# Patient Record
Sex: Male | Born: 1945 | Race: White | Hispanic: No | Marital: Married | Smoking: Former smoker
Health system: Southern US, Community
[De-identification: ages and names within clinical notes are randomized; demographics above are authoritative.]

## PROBLEM LIST (undated history)

## (undated) DIAGNOSIS — I10 Essential (primary) hypertension: Secondary | ICD-10-CM

## (undated) DIAGNOSIS — K219 Gastro-esophageal reflux disease without esophagitis: Secondary | ICD-10-CM

## (undated) DIAGNOSIS — R7303 Prediabetes: Secondary | ICD-10-CM

## (undated) DIAGNOSIS — N400 Enlarged prostate without lower urinary tract symptoms: Secondary | ICD-10-CM

## (undated) DIAGNOSIS — E785 Hyperlipidemia, unspecified: Secondary | ICD-10-CM

## (undated) DIAGNOSIS — C801 Malignant (primary) neoplasm, unspecified: Secondary | ICD-10-CM

## (undated) DIAGNOSIS — E559 Vitamin D deficiency, unspecified: Secondary | ICD-10-CM

## (undated) HISTORY — DX: Malignant (primary) neoplasm, unspecified: C80.1

## (undated) HISTORY — DX: Hyperlipidemia, unspecified: E78.5

## (undated) HISTORY — DX: Gastro-esophageal reflux disease without esophagitis: K21.9

## (undated) HISTORY — DX: Prediabetes: R73.03

## (undated) HISTORY — DX: Vitamin D deficiency, unspecified: E55.9

## (undated) HISTORY — DX: Essential (primary) hypertension: I10

## (undated) HISTORY — DX: Benign prostatic hyperplasia without lower urinary tract symptoms: N40.0

---

## 1964-11-08 HISTORY — PX: CEREBRAL ANEURYSM REPAIR: SHX164

## 1994-11-08 HISTORY — PX: CEREBRAL ANEURYSM REPAIR: SHX164

## 2000-10-17 ENCOUNTER — Encounter: Payer: Self-pay | Admitting: Emergency Medicine

## 2000-10-17 ENCOUNTER — Emergency Department (HOSPITAL_COMMUNITY): Admission: EM | Admit: 2000-10-17 | Discharge: 2000-10-17 | Payer: Self-pay | Admitting: Emergency Medicine

## 2004-01-06 ENCOUNTER — Ambulatory Visit (HOSPITAL_COMMUNITY): Admission: RE | Admit: 2004-01-06 | Discharge: 2004-01-06 | Payer: Self-pay | Admitting: Internal Medicine

## 2008-08-22 ENCOUNTER — Encounter: Admission: RE | Admit: 2008-08-22 | Discharge: 2008-08-22 | Payer: Self-pay | Admitting: Sports Medicine

## 2008-08-26 ENCOUNTER — Encounter: Admission: RE | Admit: 2008-08-26 | Discharge: 2008-08-26 | Payer: Self-pay | Admitting: Sports Medicine

## 2008-09-11 ENCOUNTER — Encounter: Admission: RE | Admit: 2008-09-11 | Discharge: 2008-09-11 | Payer: Self-pay | Admitting: Sports Medicine

## 2008-09-27 ENCOUNTER — Encounter: Admission: RE | Admit: 2008-09-27 | Discharge: 2008-09-27 | Payer: Self-pay | Admitting: Sports Medicine

## 2008-12-18 ENCOUNTER — Ambulatory Visit (HOSPITAL_COMMUNITY): Admission: RE | Admit: 2008-12-18 | Discharge: 2008-12-18 | Payer: Self-pay | Admitting: Internal Medicine

## 2009-05-26 ENCOUNTER — Encounter: Admission: RE | Admit: 2009-05-26 | Discharge: 2009-05-26 | Payer: Self-pay | Admitting: Orthopaedic Surgery

## 2009-06-23 ENCOUNTER — Encounter: Admission: RE | Admit: 2009-06-23 | Discharge: 2009-06-23 | Payer: Self-pay | Admitting: Orthopaedic Surgery

## 2009-07-10 ENCOUNTER — Encounter: Admission: RE | Admit: 2009-07-10 | Discharge: 2009-07-10 | Payer: Self-pay | Admitting: Orthopaedic Surgery

## 2010-03-10 ENCOUNTER — Ambulatory Visit (HOSPITAL_COMMUNITY): Admission: RE | Admit: 2010-03-10 | Discharge: 2010-03-10 | Payer: Self-pay | Admitting: Internal Medicine

## 2011-03-10 ENCOUNTER — Other Ambulatory Visit (HOSPITAL_COMMUNITY): Payer: Self-pay | Admitting: Internal Medicine

## 2011-03-10 ENCOUNTER — Ambulatory Visit (HOSPITAL_COMMUNITY)
Admission: RE | Admit: 2011-03-10 | Discharge: 2011-03-10 | Disposition: A | Discharge status: Home / Self Care | Payer: Medicare PPO | Source: Ambulatory Visit | Attending: Internal Medicine | Admitting: Internal Medicine

## 2011-03-10 DIAGNOSIS — R059 Cough, unspecified: Secondary | ICD-10-CM

## 2011-03-10 DIAGNOSIS — I10 Essential (primary) hypertension: Secondary | ICD-10-CM | POA: Insufficient documentation

## 2011-03-10 DIAGNOSIS — R05 Cough: Secondary | ICD-10-CM

## 2013-07-23 LAB — HM COLONOSCOPY

## 2013-10-16 ENCOUNTER — Ambulatory Visit: Payer: Self-pay | Admitting: Physician Assistant

## 2013-11-12 ENCOUNTER — Encounter: Payer: Self-pay | Admitting: Internal Medicine

## 2013-11-12 ENCOUNTER — Other Ambulatory Visit: Payer: Self-pay | Admitting: Internal Medicine

## 2013-11-12 MED ORDER — SIMVASTATIN 40 MG PO TABS: 40.0000 mg | ORAL_TABLET | Freq: Every evening | ORAL | Status: DC

## 2013-11-12 MED ORDER — ENALAPRIL MALEATE 20 MG PO TABS: 20.0000 mg | ORAL_TABLET | Freq: Every day | ORAL | Status: DC

## 2013-11-12 MED ORDER — HYDROCHLOROTHIAZIDE 25 MG PO TABS: 25.0000 mg | ORAL_TABLET | Freq: Every day | ORAL | Status: DC

## 2013-11-12 MED ORDER — ATENOLOL 25 MG PO TABS: 25.0000 mg | ORAL_TABLET | Freq: Every day | ORAL | Status: DC

## 2013-11-12 NOTE — Telephone Encounter (Signed)
This encounter was created in error - please disregard.

## 2013-12-30 DIAGNOSIS — N400 Enlarged prostate without lower urinary tract symptoms: Secondary | ICD-10-CM | POA: Insufficient documentation

## 2013-12-30 DIAGNOSIS — K219 Gastro-esophageal reflux disease without esophagitis: Secondary | ICD-10-CM | POA: Insufficient documentation

## 2013-12-30 DIAGNOSIS — E785 Hyperlipidemia, unspecified: Secondary | ICD-10-CM | POA: Insufficient documentation

## 2013-12-30 DIAGNOSIS — R7309 Other abnormal glucose: Secondary | ICD-10-CM | POA: Insufficient documentation

## 2013-12-30 DIAGNOSIS — I10 Essential (primary) hypertension: Secondary | ICD-10-CM | POA: Insufficient documentation

## 2013-12-30 DIAGNOSIS — E559 Vitamin D deficiency, unspecified: Secondary | ICD-10-CM | POA: Insufficient documentation

## 2014-01-02 ENCOUNTER — Ambulatory Visit: Payer: Self-pay | Admitting: Internal Medicine

## 2014-01-23 ENCOUNTER — Ambulatory Visit (INDEPENDENT_AMBULATORY_CARE_PROVIDER_SITE_OTHER): Payer: Medicare PPO | Admitting: Internal Medicine

## 2014-01-23 ENCOUNTER — Encounter: Payer: Self-pay | Admitting: Internal Medicine

## 2014-01-23 VITALS — BP 104/66 | HR 56 | Temp 97.7°F | Resp 16 | Ht 70.5 in | Wt 178.8 lb

## 2014-01-23 DIAGNOSIS — Z79899 Other long term (current) drug therapy: Secondary | ICD-10-CM | POA: Insufficient documentation

## 2014-01-23 DIAGNOSIS — E559 Vitamin D deficiency, unspecified: Secondary | ICD-10-CM

## 2014-01-23 DIAGNOSIS — R7303 Prediabetes: Secondary | ICD-10-CM

## 2014-01-23 DIAGNOSIS — I10 Essential (primary) hypertension: Secondary | ICD-10-CM

## 2014-01-23 DIAGNOSIS — R7309 Other abnormal glucose: Secondary | ICD-10-CM

## 2014-01-23 DIAGNOSIS — E785 Hyperlipidemia, unspecified: Secondary | ICD-10-CM

## 2014-01-23 LAB — BASIC METABOLIC PANEL WITH GFR
BUN: 20 mg/dL (ref 6–23)
CO2: 31 mEq/L (ref 19–32)
Calcium: 9.2 mg/dL (ref 8.4–10.5)
Chloride: 100 mEq/L (ref 96–112)
Creat: 1.11 mg/dL (ref 0.50–1.35)
GFR, Est African American: 78 mL/min
GFR, Est Non African American: 67 mL/min
Glucose, Bld: 67 mg/dL — ABNORMAL LOW (ref 70–99)
Potassium: 4.1 mEq/L (ref 3.5–5.3)
Sodium: 138 mEq/L (ref 135–145)

## 2014-01-23 LAB — HEPATIC FUNCTION PANEL
ALT: 14 U/L (ref 0–53)
AST: 21 U/L (ref 0–37)
Albumin: 4.1 g/dL (ref 3.5–5.2)
Alkaline Phosphatase: 97 U/L (ref 39–117)
Bilirubin, Direct: 0.1 mg/dL (ref 0.0–0.3)
Indirect Bilirubin: 0.5 mg/dL (ref 0.2–1.2)
Total Bilirubin: 0.6 mg/dL (ref 0.2–1.2)
Total Protein: 6.7 g/dL (ref 6.0–8.3)

## 2014-01-23 LAB — CBC WITH DIFFERENTIAL/PLATELET
Basophils Absolute: 0.1 10*3/uL (ref 0.0–0.1)
Basophils Relative: 1 % (ref 0–1)
Eosinophils Absolute: 0.3 10*3/uL (ref 0.0–0.7)
Eosinophils Relative: 3 % (ref 0–5)
HCT: 40.9 % (ref 39.0–52.0)
Hemoglobin: 14.1 g/dL (ref 13.0–17.0)
Lymphocytes Relative: 27 % (ref 12–46)
Lymphs Abs: 2.3 10*3/uL (ref 0.7–4.0)
MCH: 29.3 pg (ref 26.0–34.0)
MCHC: 34.5 g/dL (ref 30.0–36.0)
MCV: 85 fL (ref 78.0–100.0)
Monocytes Absolute: 0.9 10*3/uL (ref 0.1–1.0)
Monocytes Relative: 11 % (ref 3–12)
Neutro Abs: 4.9 10*3/uL (ref 1.7–7.7)
Neutrophils Relative %: 58 % (ref 43–77)
Platelets: 275 10*3/uL (ref 150–400)
RBC: 4.81 MIL/uL (ref 4.22–5.81)
RDW: 14 % (ref 11.5–15.5)
WBC: 8.4 10*3/uL (ref 4.0–10.5)

## 2014-01-23 LAB — LIPID PANEL
Cholesterol: 141 mg/dL (ref 0–200)
HDL: 51 mg/dL (ref >39)
LDL Cholesterol: 72 mg/dL (ref 0–99)
Total CHOL/HDL Ratio: 2.8 Ratio
Triglycerides: 89 mg/dL (ref <150)
VLDL: 18 mg/dL (ref 0–40)

## 2014-01-23 LAB — MAGNESIUM: Magnesium: 1.9 mg/dL (ref 1.5–2.5)

## 2014-01-23 LAB — HEMOGLOBIN A1C
Hgb A1c MFr Bld: 5.4 % (ref <5.7)
Mean Plasma Glucose: 108 mg/dL (ref <117)

## 2014-01-23 LAB — TSH: TSH: 2.027 u[IU]/mL (ref 0.350–4.500)

## 2014-01-23 NOTE — Progress Notes (Signed)
Patient ID: Brendan Monroe, male   DOB: [birth year], 68 y.o.   MRN: [id number]    This very nice 67 y.o. MWM presents for 3 month follow up with Hypertension, Hyperlipidemia, Pre-Diabetes and Vitamin D Deficiency.    HTN predates since 1996. BP has been controlled at home. Today's BP: 104/66 mmHg . Patient denies any cardiac type chest pain, palpitations, dyspnea/orthopnea/PND, dizziness, claudication, or dependent edema.   Hyperlipidemia is controlled with diet & meds. Last Cholesterol was 141, Triglycerides were 106, HDL 63 and LDL 57 in Nov 2014 at the [facility] in [place] - all at goal. Patient denies myalgias or other med SE's.      Also, the patient has history of PreDiabetes with A1c 5.7% in Aug 2012 and last A1c was 5.5% in Aug 2014. Patient denies any symptoms of reactive hypoglycemia, diabetic polys, paresthesias or visual blurring.   Further, Patient has history of Vitamin D Deficiency with last vitamin D [facility] 62 in [facility] Patient supplements vitamin D without any suspected side-effects.  Medication Sig  . atenolol (TENORMIN) 25 MG tablet Take 1 tablet (25 mg total) by mouth daily.  . enalapril (VASOTEC) 20 MG tablet Take 1 tablet (20 mg total) by mouth daily.  . hydrochlorothiazide (HYDRODIURIL) 25 MG tablet Take 1 tablet (25 mg total) by mouth daily.  . simvastatin (ZOCOR) 40 MG tablet Take 1 tablet (40 mg total) by mouth every evening.    No Known Allergies  PMHx:   Past Medical History  Diagnosis Date  . Prediabetes   . Hyperlipidemia   . Hypertension   . GERD (gastroesophageal reflux disease)   . BPH (benign prostatic hyperplasia)   . Vitamin D deficiency    FHx:    Reviewed / unchanged  SHx:    Reviewed / unchanged   Systems Review: Constitutional: Denies fever, chills, wt changes, headaches, insomnia, fatigue, night sweats, change in appetite. Eyes: Denies redness, blurred vision, diplopia, discharge, itchy, watery eyes.  ENT: Denies discharge, congestion, post  nasal drip, epistaxis, sore throat, earache, hearing loss, dental pain, tinnitus, vertigo, sinus pain, snoring.  CV: Denies chest pain, palpitations, irregular heartbeat, syncope, dyspnea, diaphoresis, orthopnea, PND, claudication, edema. Respiratory: denies cough, dyspnea, DOE, pleurisy, hoarseness, laryngitis, wheezing.  Gastrointestinal: Denies dysphagia, odynophagia, heartburn, reflux, water brash, abdominal pain or cramps, nausea, vomiting, bloating, diarrhea, constipation, hematemesis, melena, hematochezia,  or hemorrhoids. Genitourinary: Denies dysuria, frequency, urgency, nocturia, hesitancy, discharge, hematuria, flank pain. Musculoskeletal: Denies arthralgias, myalgias, stiffness, jt. swelling, pain, limp, strain/sprain.  Skin: Denies pruritus, rash, hives, warts, acne, eczema, change in skin lesion(s). Neuro: No weakness, tremor, incoordination, spasms, paresthesia, or pain. Psychiatric: Denies confusion, memory loss, or sensory loss. Endo: Denies change in weight, skin, hair change.  Heme/Lymph: No excessive bleeding, bruising, orenlarged lymph nodes.  Exam:  BP 104/66  Pulse 56  Temp 97.7 F  Resp 16  Ht 5' 10.5"   Wt 178 lb 12.8 oz   BMI 25.28 kg/m2  Appears well nourished - in no distress. Eyes: PERRLA, EOMs, conjunctiva no swelling or erythema. Sinuses: No frontal/maxillary tenderness ENT/Mouth: EAC's clear, TM's nl w/o erythema, bulging. Nares clear w/o erythema, swelling, exudates. Oropharynx clear without erythema or exudates. Oral hygiene is good. Tongue normal, non obstructing. Hearing intact.  Neck: Supple. Thyroid nl. Car 2+/2+ without bruits, nodes or JVD. Chest: Respirations nl with BS clear & equal w/o rales, rhonchi, wheezing or stridor.  Cor: Heart sounds normal w/ regular rate and rhythm without sig. murmurs, gallops, clicks, or  rubs. Peripheral pulses normal and equal  without edema.  Abdomen: Soft & bowel sounds normal. Non-tender w/o guarding, rebound,  hernias, masses, or organomegaly.  Musculoskeletal: Full ROM all peripheral extremities, joint stability, 5/5 strength, and normal gait.  Skin: Warm, dry without exposed rashes, lesions, ecchymosis apparent.  Neuro: Cranial nerves intact, reflexes equal bilaterally. Sensory-motor testing grossly intact. Tendon reflexes grossly intact.  Pysch: Alert & oriented x 3. Insight and judgement nl & appropriate. No ideations.  Assessment and Plan:  1. Hypertension - Continue monitor blood pressure at home. Continue diet/meds same.  2. Hyperlipidemia - Continue diet/meds, exercise,& lifestyle modifications. Continue monitor periodic cholesterol/liver & renal functions   3. Pre-diabetes/Insulin Resistance - Continue diet, exercise, lifestyle modifications. Monitor appropriate labs.  4. Vitamin D Deficiency - Continue supplementation.  Recommended regular exercise, BP monitoring, weight control, and discussed med and SE's. Recommended labs to assess and monitor clinical status. Further disposition pending results of labs.

## 2014-01-23 NOTE — Patient Instructions (Signed)

## 2014-01-24 LAB — INSULIN, FASTING: Insulin fasting, serum: 7 u[IU]/mL (ref 3–28)

## 2014-01-24 LAB — VITAMIN D 25 HYDROXY (VIT D DEFICIENCY, FRACTURES): Vit D, 25-Hydroxy: 99 ng/mL — ABNORMAL HIGH (ref 30–89)

## 2014-04-30 ENCOUNTER — Encounter: Payer: Self-pay | Admitting: Emergency Medicine

## 2014-04-30 ENCOUNTER — Ambulatory Visit (INDEPENDENT_AMBULATORY_CARE_PROVIDER_SITE_OTHER): Payer: Commercial Managed Care - HMO | Admitting: Emergency Medicine

## 2014-04-30 VITALS — BP 124/68 | HR 70 | Temp 98.6°F | Resp 18 | Ht 70.5 in | Wt 178.0 lb

## 2014-04-30 DIAGNOSIS — R7309 Other abnormal glucose: Secondary | ICD-10-CM

## 2014-04-30 DIAGNOSIS — Z789 Other specified health status: Secondary | ICD-10-CM

## 2014-04-30 DIAGNOSIS — Z0000 Encounter for general adult medical examination without abnormal findings: Secondary | ICD-10-CM

## 2014-04-30 DIAGNOSIS — Z1331 Encounter for screening for depression: Secondary | ICD-10-CM

## 2014-04-30 DIAGNOSIS — E782 Mixed hyperlipidemia: Secondary | ICD-10-CM

## 2014-04-30 DIAGNOSIS — I10 Essential (primary) hypertension: Secondary | ICD-10-CM

## 2014-04-30 LAB — LIPID PANEL
Cholesterol: 119 mg/dL (ref 0–200)
HDL: 53 mg/dL (ref >39)
LDL Cholesterol: 51 mg/dL (ref 0–99)
Total CHOL/HDL Ratio: 2.2 Ratio
Triglycerides: 74 mg/dL (ref <150)
VLDL: 15 mg/dL (ref 0–40)

## 2014-04-30 LAB — BASIC METABOLIC PANEL WITH GFR
BUN: 19 mg/dL (ref 6–23)
CO2: 29 mEq/L (ref 19–32)
Calcium: 9 mg/dL (ref 8.4–10.5)
Chloride: 105 mEq/L (ref 96–112)
Creat: 1.06 mg/dL (ref 0.50–1.35)
GFR, Est African American: 82 mL/min
GFR, Est Non African American: 71 mL/min
Glucose, Bld: 82 mg/dL (ref 70–99)
Potassium: 3.9 mEq/L (ref 3.5–5.3)
Sodium: 141 mEq/L (ref 135–145)

## 2014-04-30 LAB — CBC WITH DIFFERENTIAL/PLATELET
Basophils Absolute: 0 10*3/uL (ref 0.0–0.1)
Basophils Relative: 0 % (ref 0–1)
Eosinophils Absolute: 0.2 10*3/uL (ref 0.0–0.7)
Eosinophils Relative: 3 % (ref 0–5)
HCT: 38.2 % — ABNORMAL LOW (ref 39.0–52.0)
Hemoglobin: 13 g/dL (ref 13.0–17.0)
Lymphocytes Relative: 23 % (ref 12–46)
Lymphs Abs: 1.6 10*3/uL (ref 0.7–4.0)
MCH: 28.8 pg (ref 26.0–34.0)
MCHC: 34 g/dL (ref 30.0–36.0)
MCV: 84.7 fL (ref 78.0–100.0)
Monocytes Absolute: 0.6 10*3/uL (ref 0.1–1.0)
Monocytes Relative: 9 % (ref 3–12)
Neutro Abs: 4.4 10*3/uL (ref 1.7–7.7)
Neutrophils Relative %: 65 % (ref 43–77)
Platelets: 227 10*3/uL (ref 150–400)
RBC: 4.51 MIL/uL (ref 4.22–5.81)
RDW: 15.1 % (ref 11.5–15.5)
WBC: 6.8 10*3/uL (ref 4.0–10.5)

## 2014-04-30 LAB — HEPATIC FUNCTION PANEL
ALT: 17 U/L (ref 0–53)
AST: 25 U/L (ref 0–37)
Albumin: 3.9 g/dL (ref 3.5–5.2)
Alkaline Phosphatase: 84 U/L (ref 39–117)
Bilirubin, Direct: 0.2 mg/dL (ref 0.0–0.3)
Indirect Bilirubin: 0.5 mg/dL (ref 0.2–1.2)
Total Bilirubin: 0.7 mg/dL (ref 0.2–1.2)
Total Protein: 6.3 g/dL (ref 6.0–8.3)

## 2014-04-30 LAB — HEMOGLOBIN A1C
Hgb A1c MFr Bld: 5.6 % (ref <5.7)
Mean Plasma Glucose: 114 mg/dL (ref <117)

## 2014-04-30 NOTE — Progress Notes (Signed)
Patient ID: Brendan Monroe, male   DOB: October 01, 1946, 68 y.o.   MRN: [id number] Subjective:  Brendan Monroe is a 68 y.o. male who presents for Medicare Annual Wellness Visit and 3 month follow up for HTN, hyperlipidemia, prediabetes, and vitamin D Def.  Date of last medicare wellness visit was is unknown.  Right knee pain x several months without injury. He notes pain worse with certain movements on/off. He denies any tylenol because pain has not hurt that bad.   His blood pressure has been controlled at home, today their BP is BP: 124/68 mmHg He does workout. He denies chest pain, shortness of breath, dizziness.  He is on cholesterol medication and denies myalgias. His cholesterol is at goal. The cholesterol last visit was:   Lab Results  Component Value Date   CHOL 119 04/30/2014   HDL 53 04/30/2014   LDLCALC 51 04/30/2014   TRIG 74 04/30/2014   CHOLHDL 2.2 04/30/2014   He has been working on diet and exercise for prediabetes, and denies foot ulcerations, paresthesia of the feet and visual disturbances. Last A1C in the office was:  Lab Results  Component Value Date   HGBA1C 5.6 04/30/2014   Patient is on Vitamin D supplement.   No components found with this basename: VITD25OH     Names of Other Physician/Practitioners you currently use: Patient Care Team: Unk Pinto, MD as PCP - General (Internal Medicine) Inda Castle, MD as Consulting Physician (Gastroenterology) [facility]?  Medication Review: Current Outpatient Prescriptions on File Prior to Visit  Medication Sig Dispense Refill  . atenolol (TENORMIN) 25 MG tablet Take 1 tablet (25 mg total) by mouth daily.  90 tablet  2  . Cholecalciferol (VITAMIN D PO) Take 2,000 Units by mouth 2 (two) times daily.       . enalapril (VASOTEC) 20 MG tablet Take 1 tablet (20 mg total) by mouth daily.  90 tablet  2  . hydrochlorothiazide (HYDRODIURIL) 25 MG tablet Take 1 tablet (25 mg total) by mouth daily.  90  tablet  2  . Omega-3 Fatty Acids (FISH OIL PO) Take by mouth daily.      . simvastatin (ZOCOR) 40 MG tablet Take 1 tablet (40 mg total) by mouth every evening.  90 tablet  2   No current facility-administered medications on file prior to visit.   No Known Allergies  Current Problems (verified) Patient Active Problem List   Diagnosis Date Noted  . Encounter for long-term (current) use of other medications 01/23/2014  . Prediabetes   . Hyperlipidemia   . Hypertension   . GERD (gastroesophageal reflux disease)   . BPH (benign prostatic hyperplasia)   . Vitamin D deficiency     Screening Tests Health Maintenance  Topic Date Due  . Zostavax  04/06/2004  . Influenza Vaccine  06/08/2014  . Tetanus/tdap  01/25/2017  . Colonoscopy  07/24/2023  . Pneumococcal Polysaccharide Vaccine Age 37 And Over  Completed    Immunization History  Administered Date(s) Administered  . Pneumococcal Polysaccharide-23 06/05/2009  . Td 01/26/2007    Preventative care: Last colonoscopy: 07/23/13 Dentist Q 6 month EYE- Q 2 years, last exam spring 2015 WNL  Prior vaccinations: TD : 2008  Influenza: 2014 Pneumococcal: 2010/ declines Prevnar update Shingles/Zostavax: declines  Past Medical History  Diagnosis Date  . Prediabetes   . Hyperlipidemia   . Hypertension   . GERD (gastroesophageal reflux disease)   . BPH (benign prostatic hyperplasia)   . Vitamin D  deficiency    Past Surgical History  Procedure Laterality Date  . Cerebral aneurysm repair  1996   History  Substance Use Topics  . Smoking status: Former Smoker    Quit date: 04/30/1974  . Smokeless tobacco: Not on file  . Alcohol Use: No   Family History  Problem Relation Age of Onset  . Alzheimer's disease Mother   . Cancer Father     colon  . Diabetes Sister       Risk Factors: Tobacco History  Substance Use Topics  . Smoking status: Former Smoker    Quit date: 04/30/1974  . Smokeless tobacco: Not on file  .  Alcohol Use: No   He does not smoke.  Patient is a former smoker. Are there smokers in your home (other than you)?  No  Alcohol Current alcohol use: none  Caffeine Current caffeine use: denies use  Exercise Current exercise habits: Home exercise routine includes walking 1 hrs per day.  Current exercise: gardening, walking and yard work  Nutrition/Diet Current diet: in general, a "healthy" diet    Cardiac risk factors: advanced age (older than 36 for men, 50 for women), dyslipidemia and hypertension.  Depression Screen Nurse depression screen reviewed.  (Note: if answer to either of the following is "Yes", a more complete depression screening is indicated)   Q1: Over the past two weeks, have you felt down, depressed or hopeless? No  Q2: Over the past two weeks, have you felt little interest or pleasure in doing things? No  Have you lost interest or pleasure in daily life? No  Do you often feel hopeless? No  Do you cry easily over simple problems? No  Activities of Daily Living Nurse ADLs screen reviewed.  In your present state of health, do you have any difficulty performing the following activities?:  Driving? No Managing money?  No Feeding yourself? No Getting from bed to chair? No Climbing a flight of stairs? No Preparing food and eating?: No Bathing or showering? No Getting dressed: No Getting to the toilet? No Using the toilet:No Moving around from place to place: No In the past year have you fallen or had a near fall?:No   Are you sexually active?  Yes  Do you have more than one partner?  No  Vision Difficulties: No  Hearing Difficulties: No Do you often ask people to speak up or repeat themselves? No Do you experience ringing or noises in your ears? No Do you have difficulty understanding soft or whispered voices? No  Cognition  Do you feel that you have a problem with memory?No  Do you often misplace items? No  Do you feel safe at home?   Yes  Advanced directives Does patient have a [place]? Yes Does patient have a Living Will? Yes   Objective:     Vision and hearing screens reviewed.   Blood pressure 124/68, pulse 70, temperature 98.6 F (37 C), temperature source Temporal, resp. rate 18, height 5' 10.5" (1.791 m), weight 178 lb (80.74 kg). Body mass index is 25.17 kg/(m^2).  General appearance: alert, no distress, WD/WN, male Cognitive Testing  Alert? Yes  Normal Appearance?Yes  Oriented to person? Yes  Place? Yes   Time? Yes  Recall of three objects?  Yes  Can perform simple calculations? Yes  Displays appropriate judgment?Yes  Can read the correct time from a watch face?Yes  HEENT: normocephalic, sclerae anicteric, TMs pearly, nares patent, no discharge or erythema, pharynx normal Oral  cavity: MMM, no lesions Neck: supple, no lymphadenopathy, no thyromegaly, no masses Heart: RRR, normal S1, S2, no murmurs Lungs: CTA bilaterally, no wheezes, rhonchi, or rales Abdomen: +bs, soft, non tender, non distended, no masses, no hepatomegaly, no splenomegaly Musculoskeletal: nontender, no swelling, no obvious deformity Extremities: no edema, no cyanosis, no clubbing Pulses: 2+ symmetric, upper and lower extremities, normal cap refill SKIN: left great toe nail thick yellow Neurological: alert, oriented x 3, CN2-12 intact, strength normal upper extremities and lower extremities, sensation normal throughout, DTRs 2+ throughout, no cerebellar signs, gait normal Psychiatric: normal affect, behavior normal, pleasant   Assessment:  1. Medicare wellness update- Update screening labs/ History/ Immunizations/ Testing as needed. Advised healthy diet, QD exercise, increase H20 and continue RX/ Vitamins AD.  2. 3 month F/U for HTN, Cholesterol, Pre-Dm, D. Deficient. Needs healthy diet, cardio QD and obtain healthy weight. Check Labs, Check BP if >130/80 call office   3. Knee pain- continue to monitor w/c  with results, R.I.C.E call with any concerns   4. Nail fungus- Epsom salt soaks, DRY , Super glue coating QOD, f/u with results 4 weeks     Plan:  Also see above  During the course of the visit the patient was educated and counseled about appropriate screening and preventive services including:    Diabetes screening  Nutrition counseling   Screening recommendations, referrals: ALL FOLLOWING UP TO DATE OR DECLINES  Vaccinations: Tdap vaccine no  Influenza vaccine no Pneumococcal vaccine no Shingles vaccine no Hep B vaccine no  Nutrition assessed and recommended  Colonoscopy no Recommended yearly ophthalmology/optometry visit for glaucoma screening and checkup Recommended yearly dental visit for hygiene and checkup Advanced directives - no  Conditions/risks identified: BMI: Discussed weight loss, diet, and increase physical activity.  Increase physical activity: AHA recommends 150 minutes of physical activity a week.  Medications reviewed Diabetes is at goal, ACE/ARB therapy: Yes. Urinary Incontinence is not an issue: discussed non pharmacology and pharmacology options.  Fall risk: low- discussed PT, home fall assessment, medications.    Medicare Attestation I have personally reviewed: The patient's medical and social history Their use of alcohol, tobacco or illicit drugs Their current medications and supplements The patient's functional ability including ADLs,fall risks, home safety risks, cognitive, and hearing and visual impairment Diet and physical activities Evidence for depression or mood disorders  The patient's weight, height, BMI, and visual acuity have been recorded in the chart.  I have made referrals, counseling, and provided education to the patient based on review of the above and I have provided the patient with a written personalized care plan for preventive services.     Kelby Aline, R, PA-C   05/01/2014   CPT P5093 first AWV CPT [phone]  subsequent AWV

## 2014-05-02 ENCOUNTER — Ambulatory Visit: Payer: Self-pay | Admitting: Internal Medicine

## 2014-07-31 ENCOUNTER — Ambulatory Visit (INDEPENDENT_AMBULATORY_CARE_PROVIDER_SITE_OTHER): Payer: Commercial Managed Care - HMO | Admitting: Internal Medicine

## 2014-07-31 ENCOUNTER — Encounter: Payer: Self-pay | Admitting: Internal Medicine

## 2014-07-31 VITALS — BP 128/74 | HR 56 | Temp 97.7°F | Resp 16 | Ht 70.5 in | Wt 182.0 lb

## 2014-07-31 DIAGNOSIS — N400 Enlarged prostate without lower urinary tract symptoms: Secondary | ICD-10-CM

## 2014-07-31 DIAGNOSIS — Z1212 Encounter for screening for malignant neoplasm of rectum: Secondary | ICD-10-CM

## 2014-07-31 DIAGNOSIS — Z79899 Other long term (current) drug therapy: Secondary | ICD-10-CM

## 2014-07-31 DIAGNOSIS — E785 Hyperlipidemia, unspecified: Secondary | ICD-10-CM

## 2014-07-31 DIAGNOSIS — Z125 Encounter for screening for malignant neoplasm of prostate: Secondary | ICD-10-CM

## 2014-07-31 DIAGNOSIS — R7303 Prediabetes: Secondary | ICD-10-CM

## 2014-07-31 DIAGNOSIS — Z789 Other specified health status: Secondary | ICD-10-CM

## 2014-07-31 DIAGNOSIS — Z0000 Encounter for general adult medical examination without abnormal findings: Secondary | ICD-10-CM

## 2014-07-31 DIAGNOSIS — Z1331 Encounter for screening for depression: Secondary | ICD-10-CM

## 2014-07-31 DIAGNOSIS — I10 Essential (primary) hypertension: Secondary | ICD-10-CM

## 2014-07-31 DIAGNOSIS — E559 Vitamin D deficiency, unspecified: Secondary | ICD-10-CM

## 2014-07-31 DIAGNOSIS — R7309 Other abnormal glucose: Secondary | ICD-10-CM

## 2014-07-31 NOTE — Patient Instructions (Signed)
Recommend the book "The END of DIETING" by Dr Baker Janus   and the book "The END of DIABETES " by Dr Excell Seltzer  At [email or web address] - get book & Audio CD's      Being diabetic has a  300% increased risk for heart attack, stroke, cancer, and alzheimer- type vascular dementia. It is very important that you work harder with diet by avoiding all foods that are white except chicken & fish. Avoid white rice (brown & wild rice is OK), white potatoes (sweetpotatoes in moderation is OK), White bread or wheat bread or anything made out of white flour like bagels, donuts, rolls, buns, biscuits, cakes, pastries, cookies, pizza crust, and pasta (made from white flour & egg whites) - vegetarian pasta or spinach or wheat pasta is OK. Multigrain breads like Arnold's or [employer], or multigrain sandwich thins or flatbreads.  Diet, exercise and weight loss can reverse and cure diabetes in the early stages.  Diet, exercise and weight loss is very important in the control and prevention of complications of diabetes which affects every system in your body, ie. Brain - dementia/stroke, eyes - glaucoma/blindness, heart - heart attack/heart failure, kidneys - dialysis, stomach - gastric paralysis, intestines - malabsorption, nerves - severe painful neuritis, circulation - gangrene & loss of a leg(s), and finally cancer and Alzheimers.    I recommend avoid fried & greasy foods,  sweets/candy, white rice (brown or wild rice or Quinoa is OK), white potatoes (sweet potatoes are OK) - anything made from white flour - bagels, doughnuts, rolls, buns, biscuits,white and wheat breads, pizza crust and traditional pasta made of white flour & egg white(vegetarian pasta or spinach or wheat pasta is OK).  Multi-grain bread is OK - like multi-grain flat bread or sandwich thins. Avoid alcohol in excess. Exercise is also important.    Eat all the vegetables you want - avoid meat, especially red meat and dairy - especially cheese.  Cheese  is the most concentrated form of trans-fats which is the worst thing to clog up our arteries. Veggie cheese is OK which can be found in the fresh produce section at [employer] or [employer] or [employer]  Preventive Care for Adults A healthy lifestyle and preventive care can promote health and wellness. Preventive health guidelines for men include the following key practices:  A routine yearly physical is a good way to check with your health care provider about your health and preventative screening. It is a chance to share any concerns and updates on your health and to receive a thorough exam.  Visit your dentist for a routine exam and preventative care every 6 months. Brush your teeth twice a day and floss once a day. Good oral hygiene prevents tooth decay and gum disease.  The frequency of eye exams is based on your age, health, family medical history, use of contact lenses, and other factors. Follow your health care provider's recommendations for frequency of eye exams.  Eat a healthy diet. Foods such as vegetables, fruits, whole grains, low-fat dairy products, and lean protein foods contain the nutrients you need without too many calories. Decrease your intake of foods high in solid fats, added sugars, and salt. Eat the right amount of calories for you.Get information about a proper diet from your health care provider, if necessary.  Regular physical exercise is one of the most important things you can do for your health. Most adults should get at least 150 minutes of moderate-intensity exercise (any activity that  increases your heart rate and causes you to sweat) each week. In addition, most adults need muscle-strengthening exercises on 2 or more days a week.  Maintain a healthy weight. The body mass index (BMI) is a screening tool to identify possible weight problems. It provides an estimate of body fat based on height and weight. Your health care provider can find your BMI and can help you  achieve or maintain a healthy weight.For adults 20 years and older:  A BMI below 18.5 is considered underweight.  A BMI of 18.5 to 24.9 is normal.  A BMI of 25 to 29.9 is considered overweight.  A BMI of 30 and above is considered obese.  Maintain normal blood lipids and cholesterol levels by exercising and minimizing your intake of saturated fat. Eat a balanced diet with plenty of fruit and vegetables. Blood tests for lipids and cholesterol should begin at age 20 and be repeated every 5 years. If your lipid or cholesterol levels are high, you are over 50, or you are at high risk for heart disease, you may need your cholesterol levels checked more frequently.Ongoing high lipid and cholesterol levels should be treated with medicines if diet and exercise are not working.  If you smoke, find out from your health care provider how to quit. If you do not use tobacco, do not start.  Lung cancer screening is recommended for adults aged 72-80 years who are at high risk for developing lung cancer because of a history of smoking. A yearly low-dose CT scan of the lungs is recommended for people who have at least a 30-pack-year history of smoking and are a current smoker or have quit within the past 15 years. A pack year of smoking is smoking an average of 1 pack of cigarettes a day for 1 year (for example: 1 pack a day for 30 years or 2 packs a day for 15 years). Yearly screening should continue until the smoker has stopped smoking for at least 15 years. Yearly screening should be stopped for people who develop a health problem that would prevent them from having lung cancer treatment.  If you choose to drink alcohol, do not have more than 2 drinks per day. One drink is considered to be 12 ounces (355 mL) of beer, 5 ounces (148 mL) of wine, or 1.5 ounces (44 mL) of liquor.  Avoid use of street drugs. Do not share needles with anyone. Ask for help if you need support or instructions about stopping the use of  drugs.  High blood pressure causes heart disease and increases the risk of stroke. Your blood pressure should be checked at least every 1-2 years. Ongoing high blood pressure should be treated with medicines, if weight loss and exercise are not effective.  If you are 28-64 years old, ask your health care provider if you should take aspirin to prevent heart disease.  Diabetes screening involves taking a blood sample to check your fasting blood sugar level. This should be done once every 3 years, after age 13, if you are within normal weight and without risk factors for diabetes. Testing should be considered at a younger age or be carried out more frequently if you are overweight and have at least 1 risk factor for diabetes.  Colorectal cancer can be detected and often prevented. Most routine colorectal cancer screening begins at the age of 78 and continues through age 56. However, your health care provider may recommend screening at an earlier age if you have risk  factors for colon cancer. On a yearly basis, your health care provider may provide home test kits to check for hidden blood in the stool. Use of a small camera at the end of a tube to directly examine the colon (sigmoidoscopy or colonoscopy) can detect the earliest forms of colorectal cancer. Talk to your health care provider about this at age 48, when routine screening begins. Direct exam of the colon should be repeated every 5-10 years through age 60, unless early forms of precancerous polyps or small growths are found.  People who are at an increased risk for hepatitis B should be screened for this virus. You are considered at high risk for hepatitis B if:  You were born in a country where hepatitis B occurs often. Talk with your health care provider about which countries are considered high risk.  Your parents were born in a high-risk country and you have not received a shot to protect against hepatitis B (hepatitis B vaccine).  You have  HIV or AIDS.  You use needles to inject street drugs.  You live with, or have sex with, someone who has hepatitis B.  You are a man who has sex with other men (MSM).  You get hemodialysis treatment.  You take certain medicines for conditions such as cancer, organ transplantation, and autoimmune conditions.  Hepatitis C blood testing is recommended for all people born from [birth year] through 1965 and any individual with known risks for hepatitis C.  Practice safe sex. Use condoms and avoid high-risk sexual practices to reduce the spread of sexually transmitted infections (STIs). STIs include gonorrhea, chlamydia, syphilis, trichomonas, herpes, HPV, and human immunodeficiency virus (HIV). Herpes, HIV, and HPV are viral illnesses that have no cure. They can result in disability, cancer, and death.  If you are at risk of being infected with HIV, it is recommended that you take a prescription medicine daily to prevent HIV infection. This is called preexposure prophylaxis (PrEP). You are considered at risk if:  You are a man who has sex with other men (MSM) and have other risk factors.  You are a heterosexual man, are sexually active, and are at increased risk for HIV infection.  You take drugs by injection.  You are sexually active with a partner who has HIV.  Talk with your health care provider about whether you are at high risk of being infected with HIV. If you choose to begin PrEP, you should first be tested for HIV. You should then be tested every 3 months for as long as you are taking PrEP.  A one-time screening for abdominal aortic aneurysm (AAA) and surgical repair of large AAAs by ultrasound are recommended for men ages 51 to 11 years who are current or former smokers.  Healthy men should no longer receive prostate-specific antigen (PSA) blood tests as part of routine cancer screening. Talk with your health care provider about prostate cancer screening.  Testicular cancer screening is  not recommended for adult males who have no symptoms. Screening includes self-exam, a health care provider exam, and other screening tests. Consult with your health care provider about any symptoms you have or any concerns you have about testicular cancer.  Use sunscreen. Apply sunscreen liberally and repeatedly throughout the day. You should seek shade when your shadow is shorter than you. Protect yourself by wearing long sleeves, pants, a wide-brimmed hat, and sunglasses year round, whenever you are outdoors.  Once a month, do a whole-body skin exam, using a mirror to look  at the skin on your back. Tell your health care provider about new moles, moles that have irregular borders, moles that are larger than a pencil eraser, or moles that have changed in shape or color.  Stay current with required vaccines (immunizations).  Influenza vaccine. All adults should be immunized every year.  Tetanus, diphtheria, and acellular pertussis (Td, Tdap) vaccine. An adult who has not previously received Tdap or who does not know his vaccine status should receive 1 dose of Tdap. This initial dose should be followed by tetanus and diphtheria toxoids (Td) booster doses every 10 years. Adults with an unknown or incomplete history of completing a 3-dose immunization series with Td-containing vaccines should begin or complete a primary immunization series including a Tdap dose. Adults should receive a Td booster every 10 years.  Varicella vaccine. An adult without evidence of immunity to varicella should receive 2 doses or a second dose if he has previously received 1 dose.  Human papillomavirus (HPV) vaccine. Males aged 29-21 years who have not received the vaccine previously should receive the 3-dose series. Males aged 22-26 years may be immunized. Immunization is recommended through the age of 26 years for any male who has sex with males and did not get any or all doses earlier. Immunization is recommended for any  person with an immunocompromised condition through the age of 29 years if he did not get any or all doses earlier. During the 3-dose series, the second dose should be obtained 4-8 weeks after the first dose. The third dose should be obtained 24 weeks after the first dose and 16 weeks after the second dose.  Zoster vaccine. One dose is recommended for adults aged 38 years or older unless certain conditions are present.  Measles, mumps, and rubella (MMR) vaccine. Adults born before [birth year] generally are considered immune to measles and mumps. Adults born in [birth year] or later should have 1 or more doses of MMR vaccine unless there is a contraindication to the vaccine or there is laboratory evidence of immunity to each of the three diseases. A routine second dose of MMR vaccine should be obtained at least 28 days after the first dose for students attending postsecondary schools, health care workers, or international travelers. People who received inactivated measles vaccine or an unknown type of measles vaccine during 1963-1967 should receive 2 doses of MMR vaccine. People who received inactivated mumps vaccine or an unknown type of mumps vaccine before 1979 and are at high risk for mumps infection should consider immunization with 2 doses of MMR vaccine. Unvaccinated health care workers born before [birth year] who lack laboratory evidence of measles, mumps, or rubella immunity or laboratory confirmation of disease should consider measles and mumps immunization with 2 doses of MMR vaccine or rubella immunization with 1 dose of MMR vaccine.  Pneumococcal 13-valent conjugate (PCV13) vaccine. When indicated, a person who is uncertain of his immunization history and has no record of immunization should receive the PCV13 vaccine. An adult aged 68 years or older who has certain medical conditions and has not been previously immunized should receive 1 dose of PCV13 vaccine. This PCV13 should be followed with a dose of pneumococcal  polysaccharide (PPSV23) vaccine. The PPSV23 vaccine dose should be obtained at least 8 weeks after the dose of PCV13 vaccine. An adult aged 95 years or older who has certain medical conditions and previously received 1 or more doses of PPSV23 vaccine should receive 1 dose of PCV13. The PCV13 vaccine dose should be obtained 1  or more years after the last PPSV23 vaccine dose.  Pneumococcal polysaccharide (PPSV23) vaccine. When PCV13 is also indicated, PCV13 should be obtained first. All adults aged 65 years and older should be immunized. An adult younger than age 65 years who has certain medical conditions should be immunized. Any person who resides in a nursing home or long-term care facility should be immunized. An adult smoker should be immunized. People with an immunocompromised condition and certain other conditions should receive both PCV13 and PPSV23 vaccines. People with human immunodeficiency virus (HIV) infection should be immunized as soon as possible after diagnosis. Immunization during chemotherapy or radiation therapy should be avoided. Routine use of PPSV23 vaccine is not recommended for American Indians, [place] Natives, or people younger than 65 years unless there are medical conditions that require PPSV23 vaccine. When indicated, people who have unknown immunization and have no record of immunization should receive PPSV23 vaccine. One-time revaccination 5 years after the first dose of PPSV23 is recommended for people aged 19-64 years who have chronic kidney failure, nephrotic syndrome, asplenia, or immunocompromised conditions. People who received 1-2 doses of PPSV23 before age 65 years should receive another dose of PPSV23 vaccine at age 65 years or later if at least 5 years have passed since the previous dose. Doses of PPSV23 are not needed for people immunized with PPSV23 at or after age 65 years.  Meningococcal vaccine. Adults with asplenia or persistent complement component deficiencies  should receive 2 doses of quadrivalent meningococcal conjugate (MenACWY-D) vaccine. The doses should be obtained at least 2 months apart. Microbiologists working with certain meningococcal bacteria, military recruits, people at risk during an outbreak, and people who travel to or live in countries with a high rate of meningitis should be immunized. A first-year college student up through age 21 years who is living in a residence hall should receive a dose if he did not receive a dose on or after his 16th birthday. Adults who have certain high-risk conditions should receive one or more doses of vaccine.  Hepatitis A vaccine. Adults who wish to be protected from this disease, have certain high-risk conditions, work with hepatitis A-infected animals, work in hepatitis A research labs, or travel to or work in countries with a high rate of hepatitis A should be immunized. Adults who were previously unvaccinated and who anticipate close contact with an international adoptee during the first 60 days after arrival in the [place] from a country with a high rate of hepatitis A should be immunized.  Hepatitis B vaccine. Adults should be immunized if they wish to be protected from this disease, have certain high-risk conditions, may be exposed to blood or other infectious body fluids, are household contacts or sex partners of hepatitis B positive people, are clients or workers in certain care facilities, or travel to or work in countries with a high rate of hepatitis B.  Haemophilus influenzae type b (Hib) vaccine. A previously unvaccinated person with asplenia or sickle cell disease or having a scheduled splenectomy should receive 1 dose of Hib vaccine. Regardless of previous immunization, a recipient of a hematopoietic stem cell transplant should receive a 3-dose series 6-12 months after his successful transplant. Hib vaccine is not recommended for adults with HIV infection. Preventive Service /  Frequency   Ages 65 and over  Blood pressure check.** / Every 1 to 2 years.  Lipid and cholesterol check.**/ Every 5 years beginning at age 20.  Lung cancer screening. / Every year if you are aged   55-80 years and have a 30-pack-year history of smoking and currently smoke or have quit within the past 15 years. Yearly screening is stopped once you have quit smoking for at least 15 years or develop a health problem that would prevent you from having lung cancer treatment.  Fecal occult blood test (FOBT) of stool. / Every year beginning at age 50 and continuing until age 75. You may not have to do this test if you get a colonoscopy every 10 years.  Flexible sigmoidoscopy** or colonoscopy.** / Every 5 years for a flexible sigmoidoscopy or every 10 years for a colonoscopy beginning at age 50 and continuing until age 75.  Hepatitis C blood test.** / For all people born from 1945 through 1965 and any individual with known risks for hepatitis C.  Abdominal aortic aneurysm (AAA) screening for persons with hypertension or who are current or former smokers.  Skin self-exam. / Monthly.  Influenza vaccine. / Every year.  Tetanus, diphtheria, and acellular pertussis (Tdap/Td) vaccine.** / 1 dose of Td every 10 years.  Varicella vaccine.** / Consult your health care provider.  Zoster vaccine.** / 1 dose for adults aged 60 years or older.  Pneumococcal 13-valent conjugate (PCV13) vaccine.** / Consult your health care provider.  Pneumococcal polysaccharide (PPSV23) vaccine.** / 1 dose for all adults aged 65 years and older.  Meningococcal vaccine.** / Consult your health care provider.  Hepatitis A vaccine.** / Consult your health care provider.  Hepatitis B vaccine.** / Consult your health care provider.  Haemophilus influenzae type b (Hib) vaccine.** / Consult your health care provider.   

## 2014-07-31 NOTE — Progress Notes (Signed)
Patient ID: Brendan Monroe, male   DOB: 1946-08-09, 68 y.o.   MRN: [id number]  Annual Preventative Screening & Comprehensive Examination  This very nice 68 y.o.MWM presents for complete physical.  Patient has been followed for HTN, Prediabetes, Hyperlipidemia, and Vitamin D Deficiency.   HTN predates since 1996. Patient's BP has been controlled with meds at home.Today's BP was 128/74 mmHg. Patient denies any cardiac symptoms as chest pain, palpitations, shortness of breath, dizziness or ankle swelling.   Patient's hyperlipidemia is controlled with diet and medications. Patient denies myalgias or other medication SE's. Last lipids were Total Chol 119; HDL  53; LDL  51; Trig 74 on 04/30/2014.   Patient has prediabetes with A1c 5.9% since July 2010 and with better diet lost 23# in 2013, but unfortunately has regained 14# back over the last year.  He denies reactive hypoglycemic symptoms, visual blurring, diabetic polys or paresthesias. Last A1c was  5.6% on 04/30/2014.   Finally, patient has history of Vitamin D Deficiency of 41 in 2008 and patient supplements Vitamin D w/o SE's. Last vitamin D was  99 on 01/23/2014.  Medication Sig  . Atenolol 25 MG tablet Take 1 tablet (25 mg total) by mouth daily.  Marland Kitchen VITAMIN D  Take 2,000 Units by mouth 2 (two) times daily.   . enalapril  20 MG tablet Take 1 tablet (20 mg total) by mouth daily.  . hctz 25 MG tablet Take 1 tablet (25 mg total) by mouth daily.  . Omega-3 FISH OIL  Take by mouth daily.  . simvastatin  40 MG tab Take 1 tablet (40 mg total) by mouth every evening.   No Known Allergies  Past Medical History  Diagnosis Date  . Prediabetes   . Hyperlipidemia   . Hypertension   . GERD (gastroesophageal reflux disease)   . BPH (benign prostatic hyperplasia)   . Vitamin D deficiency    Past Surgical History  Procedure Laterality Date  . Cerebral aneurysm repair  1996   Family History  Problem Relation Age of Onset  . Alzheimer's disease Mother    . Cancer Father     colon  . Diabetes Sister    History   Social History  . Marital Status: Married    Spouse Name: N/A    Number of Children: N/A  . Years of Education: N/A   Occupational History  . Not on file.   Social History Main Topics  . Smoking status: Former Smoker    Quit date: 04/30/1974  . Smokeless tobacco: Not on file  . Alcohol Use: No  . Drug Use: No  . Sexual Activity: Not on file   Other Topics Concern  . Not on file   Social History Narrative  . No narrative on file    ROS Constitutional: Denies fever, chills, weight loss/gain, headaches, insomnia, fatigue, night sweats or change in appetite. Eyes: Denies redness, blurred vision, diplopia, discharge, itchy or watery eyes.  ENT: Denies discharge, congestion, post nasal drip, epistaxis, sore throat, earache, hearing loss, dental pain, Tinnitus, Vertigo, Sinus pain or snoring.  Cardio: Denies chest pain, palpitations, irregular heartbeat, syncope, dyspnea, diaphoresis, orthopnea, PND, claudication or edema Respiratory: denies cough, dyspnea, DOE, pleurisy, hoarseness, laryngitis or wheezing.  Gastrointestinal: Denies dysphagia, heartburn, reflux, water brash, pain, cramps, nausea, vomiting, bloating, diarrhea, constipation, hematemesis, melena, hematochezia, jaundice or hemorrhoids Genitourinary: Denies dysuria, frequency, urgency, nocturia, hesitancy, discharge, hematuria or flank pain Musculoskeletal: Denies arthralgia, myalgia, stiffness, Jt. Swelling, pain, limp or strain/sprain. Denies Falls. Skin:  Denies puritis, rash, hives, warts, acne, eczema or change in skin lesion Neuro: No weakness, tremor, incoordination, spasms, paresthesia or pain Psychiatric: Denies confusion, memory loss or sensory loss. Denies Depression. Endocrine: Denies change in weight, skin, hair change, nocturia, and paresthesia, diabetic polys, visual blurring or hyper / hypo glycemic episodes.  Heme/Lymph: No excessive bleeding,  bruising or enlarged lymph nodes.  Physical Exam  BP 128/74  Pulse 56  Temp 97.7 F   Resp 16  Ht 5' 10.5"   Wt 182 lb   BMI 25.74   General Appearance: Well nourished, in no apparent distress. Eyes: PERRLA, EOMs, conjunctiva no swelling or erythema, normal fundi and vessels. Sinuses: No frontal/maxillary tenderness ENT/Mouth: EACs patent / TMs  nl. Nares clear without erythema, swelling, mucoid exudates. Oral hygiene is good. No erythema, swelling, or exudate. Tongue normal, non-obstructing. Tonsils not swollen or erythematous. Hearing normal.  Neck: Supple, thyroid normal. No bruits, nodes or JVD. Respiratory: Respiratory effort normal.  BS equal and clear bilateral without rales, rhonci, wheezing or stridor. Cardio: Heart sounds are normal with regular rate and rhythm and no murmurs, rubs or gallops. Peripheral pulses are normal and equal bilaterally without edema. No aortic or femoral bruits. Chest: symmetric with normal excursions and percussion.  Abdomen: Flat, soft, with bowl sounds. Nontender, no guarding, rebound, hernias, masses, or organomegaly.  Lymphatics: Non tender without lymphadenopathy.  Genitourinary: No hernias.Testes nl. DRE - prostate nl for age - smooth & firm w/o nodules. Musculoskeletal: Full ROM all peripheral extremities, joint stability, 5/5 strength, and normal gait. Skin: Warm and dry without rashes, lesions, cyanosis, clubbing or  ecchymosis.  Neuro: Cranial nerves intact, reflexes equal bilaterally. Normal muscle tone, no cerebellar symptoms. Sensation intact.  Pysch: Awake and oriented X 3with normal affect, insight and judgment appropriate.   Assessment and Plan  1. Annual Preventative Screening Examination 2. Hypertension  3. Hyperlipidemia 4. Pre Diabetes 5. Vitamin D Deficiency 6. GERD 7. BPH  Continue prudent diet as discussed, weight control, BP monitoring, regular exercise, and medications as discussed.  Discussed med effects and SE's.  Routine screening labs and tests as requested with regular follow-up as recommended.

## 2014-08-01 LAB — MAGNESIUM: Magnesium: 1.9 mg/dL (ref 1.5–2.5)

## 2014-08-01 LAB — CBC WITH DIFFERENTIAL/PLATELET
Basophils Absolute: 0.1 10*3/uL (ref 0.0–0.1)
Basophils Relative: 1 % (ref 0–1)
Eosinophils Absolute: 0.2 10*3/uL (ref 0.0–0.7)
Eosinophils Relative: 3 % (ref 0–5)
HCT: 43.3 % (ref 39.0–52.0)
Hemoglobin: 14.2 g/dL (ref 13.0–17.0)
Lymphocytes Relative: 30 % (ref 12–46)
Lymphs Abs: 1.9 10*3/uL (ref 0.7–4.0)
MCH: 28.5 pg (ref 26.0–34.0)
MCHC: 32.8 g/dL (ref 30.0–36.0)
MCV: 86.8 fL (ref 78.0–100.0)
Monocytes Absolute: 0.6 10*3/uL (ref 0.1–1.0)
Monocytes Relative: 9 % (ref 3–12)
Neutro Abs: 3.6 10*3/uL (ref 1.7–7.7)
Neutrophils Relative %: 57 % (ref 43–77)
Platelets: 238 10*3/uL (ref 150–400)
RBC: 4.99 MIL/uL (ref 4.22–5.81)
RDW: 15.5 % (ref 11.5–15.5)
WBC: 6.4 10*3/uL (ref 4.0–10.5)

## 2014-08-01 LAB — BASIC METABOLIC PANEL WITH GFR
BUN: 17 mg/dL (ref 6–23)
CO2: 28 mEq/L (ref 19–32)
Calcium: 9.7 mg/dL (ref 8.4–10.5)
Chloride: 100 mEq/L (ref 96–112)
Creat: 1.05 mg/dL (ref 0.50–1.35)
GFR, Est African American: 83 mL/min
GFR, Est Non African American: 72 mL/min
Glucose, Bld: 76 mg/dL (ref 70–99)
Potassium: 4.1 mEq/L (ref 3.5–5.3)
Sodium: 137 mEq/L (ref 135–145)

## 2014-08-01 LAB — LIPID PANEL
Cholesterol: 150 mg/dL (ref 0–200)
HDL: 68 mg/dL (ref >39)
LDL Cholesterol: 67 mg/dL (ref 0–99)
Total CHOL/HDL Ratio: 2.2 Ratio
Triglycerides: 75 mg/dL (ref <150)
VLDL: 15 mg/dL (ref 0–40)

## 2014-08-01 LAB — HEMOGLOBIN A1C
Hgb A1c MFr Bld: 5.4 % (ref <5.7)
Mean Plasma Glucose: 108 mg/dL (ref <117)

## 2014-08-01 LAB — HEPATIC FUNCTION PANEL
ALT: 19 U/L (ref 0–53)
AST: 25 U/L (ref 0–37)
Albumin: 4.3 g/dL (ref 3.5–5.2)
Alkaline Phosphatase: 86 U/L (ref 39–117)
Bilirubin, Direct: 0.2 mg/dL (ref 0.0–0.3)
Indirect Bilirubin: 0.5 mg/dL (ref 0.2–1.2)
Total Bilirubin: 0.7 mg/dL (ref 0.2–1.2)
Total Protein: 6.8 g/dL (ref 6.0–8.3)

## 2014-08-01 LAB — VITAMIN D 25 HYDROXY (VIT D DEFICIENCY, FRACTURES): Vit D, 25-Hydroxy: 82 ng/mL (ref 30–89)

## 2014-08-01 LAB — MICROALBUMIN / CREATININE URINE RATIO
Creatinine, Urine: 19.3 mg/dL
Microalb, Ur: <0.2 mg/dL (ref <2.0)

## 2014-08-01 LAB — URINALYSIS, MICROSCOPIC ONLY
Bacteria, UA: NONE SEEN
Casts: NONE SEEN
Crystals: NONE SEEN
Squamous Epithelial / LPF: NONE SEEN

## 2014-08-01 LAB — TSH: TSH: 2.935 u[IU]/mL (ref 0.350–4.500)

## 2014-08-01 LAB — PSA: PSA: 1.37 ng/mL (ref <=4.00)

## 2014-08-01 LAB — INSULIN, FASTING: Insulin fasting, serum: 3.4 u[IU]/mL (ref 2.0–19.6)

## 2014-08-04 ENCOUNTER — Other Ambulatory Visit: Payer: Self-pay | Admitting: Internal Medicine

## 2014-08-06 ENCOUNTER — Ambulatory Visit: Payer: Self-pay | Admitting: Internal Medicine

## 2014-08-12 ENCOUNTER — Other Ambulatory Visit: Payer: Self-pay | Admitting: *Deleted

## 2014-08-12 ENCOUNTER — Other Ambulatory Visit (INDEPENDENT_AMBULATORY_CARE_PROVIDER_SITE_OTHER): Payer: Commercial Managed Care - HMO | Admitting: *Deleted

## 2014-08-12 DIAGNOSIS — Z1212 Encounter for screening for malignant neoplasm of rectum: Secondary | ICD-10-CM

## 2014-08-12 LAB — POC HEMOCCULT BLD/STL (HOME/3-CARD/SCREEN)
Card #2 Fecal Occult Blod, POC: NEGATIVE
Card #3 Fecal Occult Blood, POC: NEGATIVE
Fecal Occult Blood, POC: NEGATIVE

## 2014-08-12 MED ORDER — ENALAPRIL MALEATE 20 MG PO TABS: 20.0000 mg | ORAL_TABLET | Freq: Every day | ORAL | Status: DC

## 2014-08-21 ENCOUNTER — Ambulatory Visit (INDEPENDENT_AMBULATORY_CARE_PROVIDER_SITE_OTHER): Payer: Commercial Managed Care - HMO | Admitting: *Deleted

## 2014-08-21 DIAGNOSIS — Z23 Encounter for immunization: Secondary | ICD-10-CM

## 2014-08-22 ENCOUNTER — Ambulatory Visit: Payer: Self-pay | Admitting: Internal Medicine

## 2014-10-07 ENCOUNTER — Other Ambulatory Visit: Payer: Self-pay | Admitting: *Deleted

## 2014-10-07 MED ORDER — HYDROCHLOROTHIAZIDE 25 MG PO TABS: 25.0000 mg | ORAL_TABLET | Freq: Every day | ORAL | Status: DC

## 2014-10-07 MED ORDER — ENALAPRIL MALEATE 20 MG PO TABS: 20.0000 mg | ORAL_TABLET | Freq: Every day | ORAL | Status: DC

## 2014-10-07 MED ORDER — SIMVASTATIN 40 MG PO TABS: 40.0000 mg | ORAL_TABLET | Freq: Every evening | ORAL | Status: DC

## 2014-11-26 ENCOUNTER — Ambulatory Visit: Payer: Self-pay | Admitting: Physician Assistant

## 2015-03-03 ENCOUNTER — Encounter: Payer: Self-pay | Admitting: Internal Medicine

## 2015-03-03 ENCOUNTER — Ambulatory Visit (INDEPENDENT_AMBULATORY_CARE_PROVIDER_SITE_OTHER): Payer: PPO | Admitting: Internal Medicine

## 2015-03-03 VITALS — BP 122/64 | HR 60 | Temp 97.3°F | Resp 16 | Ht 70.5 in | Wt 180.2 lb

## 2015-03-03 DIAGNOSIS — K219 Gastro-esophageal reflux disease without esophagitis: Secondary | ICD-10-CM

## 2015-03-03 DIAGNOSIS — R7309 Other abnormal glucose: Secondary | ICD-10-CM

## 2015-03-03 DIAGNOSIS — E559 Vitamin D deficiency, unspecified: Secondary | ICD-10-CM

## 2015-03-03 DIAGNOSIS — Z79899 Other long term (current) drug therapy: Secondary | ICD-10-CM

## 2015-03-03 DIAGNOSIS — E785 Hyperlipidemia, unspecified: Secondary | ICD-10-CM

## 2015-03-03 DIAGNOSIS — R7303 Prediabetes: Secondary | ICD-10-CM

## 2015-03-03 DIAGNOSIS — I10 Essential (primary) hypertension: Secondary | ICD-10-CM

## 2015-03-03 LAB — CBC WITH DIFFERENTIAL/PLATELET
Basophils Absolute: 0 10*3/uL (ref 0.0–0.1)
Basophils Relative: 1 % (ref 0–1)
Eosinophils Absolute: 0.1 10*3/uL (ref 0.0–0.7)
Eosinophils Relative: 3 % (ref 0–5)
HCT: 42.2 % (ref 39.0–52.0)
Hemoglobin: 14.1 g/dL (ref 13.0–17.0)
Lymphocytes Relative: 29 % (ref 12–46)
Lymphs Abs: 1.4 10*3/uL (ref 0.7–4.0)
MCH: 29.3 pg (ref 26.0–34.0)
MCHC: 33.4 g/dL (ref 30.0–36.0)
MCV: 87.6 fL (ref 78.0–100.0)
MPV: 9.5 fL (ref 8.6–12.4)
Monocytes Absolute: 0.5 10*3/uL (ref 0.1–1.0)
Monocytes Relative: 10 % (ref 3–12)
Neutro Abs: 2.8 10*3/uL (ref 1.7–7.7)
Neutrophils Relative %: 57 % (ref 43–77)
Platelets: 210 10*3/uL (ref 150–400)
RBC: 4.82 MIL/uL (ref 4.22–5.81)
RDW: 15 % (ref 11.5–15.5)
WBC: 4.9 10*3/uL (ref 4.0–10.5)

## 2015-03-03 LAB — BASIC METABOLIC PANEL WITH GFR
BUN: 21 mg/dL (ref 6–23)
CO2: 29 mEq/L (ref 19–32)
Calcium: 9.2 mg/dL (ref 8.4–10.5)
Chloride: 100 mEq/L (ref 96–112)
Creat: 1.19 mg/dL (ref 0.50–1.35)
GFR, Est African American: 71 mL/min
GFR, Est Non African American: 62 mL/min
Glucose, Bld: 79 mg/dL (ref 70–99)
Potassium: 4.1 mEq/L (ref 3.5–5.3)
Sodium: 137 mEq/L (ref 135–145)

## 2015-03-03 LAB — HEPATIC FUNCTION PANEL
ALT: 21 U/L (ref 0–53)
AST: 26 U/L (ref 0–37)
Albumin: 4.2 g/dL (ref 3.5–5.2)
Alkaline Phosphatase: 71 U/L (ref 39–117)
Bilirubin, Direct: 0.2 mg/dL (ref 0.0–0.3)
Indirect Bilirubin: 0.7 mg/dL (ref 0.2–1.2)
Total Bilirubin: 0.9 mg/dL (ref 0.2–1.2)
Total Protein: 6.6 g/dL (ref 6.0–8.3)

## 2015-03-03 LAB — LIPID PANEL
Cholesterol: 146 mg/dL (ref 0–200)
HDL: 61 mg/dL (ref >=40)
LDL Cholesterol: 73 mg/dL (ref 0–99)
Total CHOL/HDL Ratio: 2.4 Ratio
Triglycerides: 58 mg/dL (ref <150)
VLDL: 12 mg/dL (ref 0–40)

## 2015-03-03 LAB — HEMOGLOBIN A1C
Hgb A1c MFr Bld: 5.5 % (ref <5.7)
Mean Plasma Glucose: 111 mg/dL (ref <117)

## 2015-03-03 LAB — TSH: TSH: 2.11 u[IU]/mL (ref 0.350–4.500)

## 2015-03-03 LAB — MAGNESIUM: Magnesium: 1.9 mg/dL (ref 1.5–2.5)

## 2015-03-03 NOTE — Patient Instructions (Signed)

## 2015-03-03 NOTE — Progress Notes (Signed)
Patient ID: Brendan Monroe, male   DOB: [birth year], 69 y.o.   MRN: [id number]   This very nice 69 y.o. MWM presents for 3 month follow up with Hypertension, Hyperlipidemia, Pre-Diabetes and Vitamin D Deficiency.    Patient is treated for HTN & BP has been controlled at home. Today's BP: 122/64 mmHg. Patient has had no complaints of any cardiac type chest pain, palpitations, dyspnea/orthopnea/PND, dizziness, claudication, or dependent edema.   Hyperlipidemia is controlled with diet & meds. Patient denies myalgias or other med SE's. Last Lipids were at goal - Total  Chol 150; HDL 68; LDL  67; Trig 75 on 07/31/2014:   Also, the patient has history of PreDiabetes since 2010 with an A1c of 5.9% and has had no symptoms of reactive hypoglycemia, diabetic polys, paresthesias or visual blurring.  Last A1c was 5.4% on 07/31/2014.   Further, the patient also has history of Vitamin D Deficiency of 41 on treatment in 2008 and supplements vitamin D without any suspected side-effects. Last vitamin D was 82 on 07/31/2014.  Medication Sig  . atenolol (TENORMIN) 25 MG tablet TAKE 1 TABLET EVERY DAY  . Cholecalciferol (VITAMIN D PO) Take 2,000 Units by mouth 2 (two) times daily.   . enalapril (VASOTEC) 20 MG tablet Take 1 tablet (20 mg total) by mouth daily.  . hydrochlorothiazide (HYDRODIURIL) 25 MG tablet Take 1 tablet (25 mg total) by mouth daily.  . Omega-3 Fatty Acids (FISH OIL PO) Take by mouth daily.  . simvastatin (ZOCOR) 40 MG tablet Take 1 tablet (40 mg total) by mouth every evening.   No Known Allergies  PMHx:   Past Medical History  Diagnosis Date  . Prediabetes   . Hyperlipidemia   . Hypertension   . GERD (gastroesophageal reflux disease)   . BPH (benign prostatic hyperplasia)   . Vitamin D deficiency    Immunization History  Administered Date(s) Administered  . Influenza, High Dose Seasonal PF 08/21/2014  . Pneumococcal Polysaccharide-23 06/05/2009  . Td 01/26/2007   Past Surgical  History  Procedure Laterality Date  . Cerebral aneurysm repair  1996   FHx:    Reviewed / unchanged  SHx:    Reviewed / unchanged  Systems Review:  Constitutional: Denies fever, chills, wt changes, headaches, insomnia, fatigue, night sweats, change in appetite. Eyes: Denies redness, blurred vision, diplopia, discharge, itchy, watery eyes.  ENT: Denies discharge, congestion, post nasal drip, epistaxis, sore throat, earache, hearing loss, dental pain, tinnitus, vertigo, sinus pain, snoring.  CV: Denies chest pain, palpitations, irregular heartbeat, syncope, dyspnea, diaphoresis, orthopnea, PND, claudication or edema. Respiratory: denies cough, dyspnea, DOE, pleurisy, hoarseness, laryngitis, wheezing.  Gastrointestinal: Denies dysphagia, odynophagia, heartburn, reflux, water brash, abdominal pain or cramps, nausea, vomiting, bloating, diarrhea, constipation, hematemesis, melena, hematochezia  or hemorrhoids. Genitourinary: Denies dysuria, frequency, urgency, nocturia, hesitancy, discharge, hematuria or flank pain. Musculoskeletal: Denies arthralgias, myalgias, stiffness, jt. swelling, pain, limping or strain/sprain.  Skin: Denies pruritus, rash, hives, warts, acne, eczema or change in skin lesion(s). Neuro: No weakness, tremor, incoordination, spasms, paresthesia or pain. Psychiatric: Denies confusion, memory loss or sensory loss. Endo: Denies change in weight, skin or hair change.  Heme/Lymph: No excessive bleeding, bruising or enlarged lymph nodes.  Physical Exam  BP 122/64   Pulse 60  Temp 97.3 F Resp 16  Ht 5' 10.5"   Wt 180 lb 3.2 oz     BMI 25.48   Appears well nourished and in no distress. Eyes: PERRLA, EOMs, conjunctiva no swelling or erythema. Sinuses:  No frontal/maxillary tenderness ENT/Mouth: EAC's clear, TM's nl w/o erythema, bulging. Nares clear w/o erythema, swelling, exudates. Oropharynx clear without erythema or exudates. Oral hygiene is good. Tongue normal, non  obstructing. Hearing intact.  Neck: Supple. Thyroid nl. Car 2+/2+ without bruits, nodes or JVD. Chest: Respirations nl with BS clear & equal w/o rales, rhonchi, wheezing or stridor.  Cor: Heart sounds normal w/ regular rate and rhythm without sig. murmurs, gallops, clicks, or rubs. Peripheral pulses normal and equal  without edema.  Abdomen: Soft & bowel sounds normal. Non-tender w/o guarding, rebound, hernias, masses, or organomegaly.  Lymphatics: Unremarkable.  Musculoskeletal: Full ROM all peripheral extremities, joint stability, 5/5 strength, and normal gait.  Skin: Warm, dry without exposed rashes, lesions or ecchymosis apparent.  Neuro: Cranial nerves intact, reflexes equal bilaterally. Sensory-motor testing grossly intact. Tendon reflexes grossly intact.  Pysch: Alert & oriented x 3.  Insight and judgement nl & appropriate. No ideations.  Assessment and Plan:  1. Essential hypertension  - TSH  2. Hyperlipidemia  - Lipid panel  3. Prediabetes  - Hemoglobin A1c - Insulin, random  4. Vitamin D deficiency  - Vit D  25 hydroxy (rtn osteoporosis monitoring)  5. Gastroesophageal reflux disease, esophagitis presence not specified   6. Medication management  - CBC with Differential/Platelet - BASIC METABOLIC PANEL WITH GFR - Hepatic function panel - Magnesium   Recommended regular exercise, BP monitoring, weight control, and discussed med and SE's. Recommended labs to assess and monitor clinical status. Further disposition pending results of labs. Over 30 minutes of exam, counseling, chart review was performed

## 2015-03-04 LAB — INSULIN, RANDOM: Insulin: 3.5 u[IU]/mL (ref 2.0–19.6)

## 2015-03-04 LAB — VITAMIN D 25 HYDROXY (VIT D DEFICIENCY, FRACTURES): Vit D, 25-Hydroxy: 60 ng/mL (ref 30–100)

## 2015-05-12 ENCOUNTER — Encounter: Payer: Self-pay | Admitting: *Deleted

## 2015-06-04 ENCOUNTER — Ambulatory Visit: Payer: Self-pay | Admitting: Internal Medicine

## 2015-06-19 ENCOUNTER — Ambulatory Visit (INDEPENDENT_AMBULATORY_CARE_PROVIDER_SITE_OTHER): Payer: PPO | Admitting: Internal Medicine

## 2015-06-19 ENCOUNTER — Encounter: Payer: Self-pay | Admitting: Internal Medicine

## 2015-06-19 VITALS — BP 128/72 | HR 60 | Temp 98.2°F | Resp 16 | Ht 70.5 in | Wt 183.0 lb

## 2015-06-19 DIAGNOSIS — Z79899 Other long term (current) drug therapy: Secondary | ICD-10-CM | POA: Diagnosis not present

## 2015-06-19 DIAGNOSIS — R7309 Other abnormal glucose: Secondary | ICD-10-CM | POA: Diagnosis not present

## 2015-06-19 DIAGNOSIS — E785 Hyperlipidemia, unspecified: Secondary | ICD-10-CM

## 2015-06-19 DIAGNOSIS — E559 Vitamin D deficiency, unspecified: Secondary | ICD-10-CM | POA: Diagnosis not present

## 2015-06-19 DIAGNOSIS — I10 Essential (primary) hypertension: Secondary | ICD-10-CM | POA: Diagnosis not present

## 2015-06-19 DIAGNOSIS — R7303 Prediabetes: Secondary | ICD-10-CM

## 2015-06-19 LAB — CBC WITH DIFFERENTIAL/PLATELET
Basophils Absolute: 0.1 10*3/uL (ref 0.0–0.1)
Basophils Relative: 1 % (ref 0–1)
Eosinophils Absolute: 0.2 10*3/uL (ref 0.0–0.7)
Eosinophils Relative: 3 % (ref 0–5)
HCT: 39.2 % (ref 39.0–52.0)
Hemoglobin: 13.1 g/dL (ref 13.0–17.0)
Lymphocytes Relative: 30 % (ref 12–46)
Lymphs Abs: 1.9 10*3/uL (ref 0.7–4.0)
MCH: 29.5 pg (ref 26.0–34.0)
MCHC: 33.4 g/dL (ref 30.0–36.0)
MCV: 88.3 fL (ref 78.0–100.0)
MPV: 9.3 fL (ref 8.6–12.4)
Monocytes Absolute: 0.8 10*3/uL (ref 0.1–1.0)
Monocytes Relative: 12 % (ref 3–12)
Neutro Abs: 3.4 10*3/uL (ref 1.7–7.7)
Neutrophils Relative %: 54 % (ref 43–77)
Platelets: 214 10*3/uL (ref 150–400)
RBC: 4.44 MIL/uL (ref 4.22–5.81)
RDW: 14.4 % (ref 11.5–15.5)
WBC: 6.3 10*3/uL (ref 4.0–10.5)

## 2015-06-19 LAB — LIPID PANEL
Cholesterol: 143 mg/dL (ref 125–200)
HDL: 61 mg/dL (ref >=40)
LDL Cholesterol: 66 mg/dL (ref <130)
Total CHOL/HDL Ratio: 2.3 Ratio (ref <=5.0)
Triglycerides: 81 mg/dL (ref <150)
VLDL: 16 mg/dL (ref <30)

## 2015-06-19 LAB — HEPATIC FUNCTION PANEL
ALT: 18 U/L (ref 9–46)
AST: 26 U/L (ref 10–35)
Albumin: 4.1 g/dL (ref 3.6–5.1)
Alkaline Phosphatase: 66 U/L (ref 40–115)
Bilirubin, Direct: 0.1 mg/dL (ref <=0.2)
Indirect Bilirubin: 0.5 mg/dL (ref 0.2–1.2)
Total Bilirubin: 0.6 mg/dL (ref 0.2–1.2)
Total Protein: 6.5 g/dL (ref 6.1–8.1)

## 2015-06-19 LAB — BASIC METABOLIC PANEL WITH GFR
BUN: 21 mg/dL (ref 7–25)
CO2: 28 mmol/L (ref 20–31)
Calcium: 8.9 mg/dL (ref 8.6–10.3)
Chloride: 105 mmol/L (ref 98–110)
Creat: 1.16 mg/dL (ref 0.70–1.18)
GFR, Est African American: 73 mL/min (ref >=60)
GFR, Est Non African American: 63 mL/min (ref >=60)
Glucose, Bld: 81 mg/dL (ref 65–99)
Potassium: 4.2 mmol/L (ref 3.5–5.3)
Sodium: 141 mmol/L (ref 135–146)

## 2015-06-19 LAB — MAGNESIUM: Magnesium: 2 mg/dL (ref 1.5–2.5)

## 2015-06-19 LAB — TSH: TSH: 2.954 u[IU]/mL (ref 0.350–4.500)

## 2015-06-19 LAB — HEMOGLOBIN A1C
Hgb A1c MFr Bld: 5.5 % (ref <5.7)
Mean Plasma Glucose: 111 mg/dL (ref <117)

## 2015-06-19 NOTE — Progress Notes (Signed)
Assessment and Plan:  Hypertension:  -stop enalapril -move atenolol to nighttime dose -Continue medication,  -monitor blood pressure at home.  -Continue DASH diet.   -Reminder to go to the ER if any CP, SOB, nausea, dizziness, severe HA, changes vision/speech, left arm numbness and tingling, and jaw pain.  Cholesterol: -Continue diet and exercise.  -Check cholesterol.   Pre-diabetes: -Continue diet and exercise.  -Check A1C  Vitamin D Def: -check level -continue medications.   Cough -try nexium x 2 weeks  Continue diet and meds as discussed. Further disposition pending results of labs.  HPI 69 y.o. male  presents for 3 month follow up with hypertension, hyperlipidemia, prediabetes and vitamin D.   His blood pressure has been controlled at home, today their BP is BP: 128/72 mmHg.   He does workout. He denies chest pain, shortness of breath, dizziness.  He has not been doing a lot of walking outside lately.  He reports that he needs   He is on cholesterol medication and denies myalgias. His cholesterol is at goal. The cholesterol last visit was:   Lab Results  Component Value Date   CHOL 146 03/03/2015   HDL 61 03/03/2015   LDLCALC 73 03/03/2015   TRIG 58 03/03/2015   CHOLHDL 2.4 03/03/2015     He has been working on diet and exercise for prediabetes, and denies foot ulcerations, hyperglycemia, hypoglycemia , increased appetite, nausea, paresthesia of the feet, polydipsia, polyuria, visual disturbances, vomiting and weight loss. Last A1C in the office was:  Lab Results  Component Value Date   HGBA1C 5.5 03/03/2015    Patient is on Vitamin D supplement.  Lab Results  Component Value Date   VD25OH 60 03/03/2015     He reports that he has been recently having a dry cough that has been bothering him for a long time.  He reports that he has been on the enalipril for a long time.  He reports that it has gotten better since stopping medication.  He does occasionally get  acid reflux.   Current Medications:  Current Outpatient Prescriptions on File Prior to Visit  Medication Sig Dispense Refill  . atenolol (TENORMIN) 25 MG tablet TAKE 1 TABLET EVERY DAY 90 tablet 2  . Cholecalciferol (VITAMIN D PO) Take 2,000 Units by mouth 2 (two) times daily.     . hydrochlorothiazide (HYDRODIURIL) 25 MG tablet Take 1 tablet (25 mg total) by mouth daily. 90 tablet 2  . Omega-3 Fatty Acids (FISH OIL PO) Take by mouth daily.    . simvastatin (ZOCOR) 40 MG tablet Take 1 tablet (40 mg total) by mouth every evening. 90 tablet 2   No current facility-administered medications on file prior to visit.    Medical History:  Past Medical History  Diagnosis Date  . Prediabetes   . Hyperlipidemia   . Hypertension   . GERD (gastroesophageal reflux disease)   . BPH (benign prostatic hyperplasia)   . Vitamin D deficiency     Allergies: No Known Allergies   Review of Systems:  Review of Systems  Constitutional: Negative for fever, chills and malaise/fatigue.  HENT: Negative for congestion, ear pain and sore throat.   Eyes: Negative.   Respiratory: Positive for cough. Negative for shortness of breath and wheezing.   Cardiovascular: Negative for chest pain, palpitations and leg swelling.  Gastrointestinal: Positive for heartburn. Negative for diarrhea, constipation, blood in stool and melena.  Genitourinary: Negative.   Neurological: Negative for dizziness, sensory change, loss of consciousness  and headaches.  Psychiatric/Behavioral: Negative for depression. The patient is not nervous/anxious and does not have insomnia.     Family history- Review and unchanged  Social history- Review and unchanged  Physical Exam: BP 128/72 mmHg  Pulse 60  Temp(Src) 98.2 F (36.8 C) (Temporal)  Resp 16  Ht 5' 10.5" (1.791 m)  Wt 183 lb (83.008 kg)  BMI 25.88 kg/m2 Wt Readings from Last 3 Encounters:  06/19/15 183 lb (83.008 kg)  03/03/15 180 lb 3.2 oz (81.738 kg)  07/31/14 182 lb  (82.555 kg)    General Appearance: Well nourished well developed, in no apparent distress. Eyes: PERRLA, EOMs, conjunctiva no swelling or erythema ENT/Mouth: Ear canals normal without obstruction, swelling, erythma, discharge.  TMs normal bilaterally.  Oropharynx moist, clear, without exudate, or postoropharyngeal swelling. Neck: Supple, thyroid normal,no cervical adenopathy  Respiratory: Respiratory effort normal, Breath sounds clear A&P without rhonchi, wheeze, or rale.  No retractions, no accessory usage. Cardio: RRR with no MRGs. Brisk peripheral pulses without edema.  Abdomen: Soft, + BS,  Non tender, no guarding, rebound, hernias, masses. Musculoskeletal: Full ROM, 5/5 strength, Normal gait Skin: Warm, dry without rashes, lesions, ecchymosis.  Neuro: Awake and oriented X 3, Cranial nerves intact. Normal muscle tone, no cerebellar symptoms. Psych: Normal affect, Insight and Judgment appropriate.    Starlyn Skeans, PA-C 11:06 AM [facility]

## 2015-06-19 NOTE — Patient Instructions (Signed)
Continue to stay off the enalapril to see if this will help with the coughing.  Lets try 2 weeks of nexium daily to see if this will help with some of the coughing as well.  Change atenolol to night time dosing.  Please add in more exercise.

## 2015-06-20 LAB — INSULIN, RANDOM: Insulin: 3.4 u[IU]/mL (ref 2.0–19.6)

## 2015-06-20 LAB — VITAMIN D 25 HYDROXY (VIT D DEFICIENCY, FRACTURES): Vit D, 25-Hydroxy: 62 ng/mL (ref 30–100)

## 2015-08-01 ENCOUNTER — Encounter: Payer: Self-pay | Admitting: Internal Medicine

## 2015-09-09 ENCOUNTER — Encounter: Payer: Self-pay | Admitting: Internal Medicine

## 2015-09-09 ENCOUNTER — Ambulatory Visit (INDEPENDENT_AMBULATORY_CARE_PROVIDER_SITE_OTHER): Payer: PPO | Admitting: Internal Medicine

## 2015-09-09 VITALS — BP 130/82 | HR 60 | Temp 97.0°F | Resp 16 | Ht 70.25 in | Wt 186.8 lb

## 2015-09-09 DIAGNOSIS — Z23 Encounter for immunization: Secondary | ICD-10-CM

## 2015-09-09 DIAGNOSIS — Z1331 Encounter for screening for depression: Secondary | ICD-10-CM

## 2015-09-09 DIAGNOSIS — Z1389 Encounter for screening for other disorder: Secondary | ICD-10-CM

## 2015-09-09 DIAGNOSIS — R7303 Prediabetes: Secondary | ICD-10-CM

## 2015-09-09 DIAGNOSIS — E559 Vitamin D deficiency, unspecified: Secondary | ICD-10-CM

## 2015-09-09 DIAGNOSIS — Z789 Other specified health status: Secondary | ICD-10-CM

## 2015-09-09 DIAGNOSIS — N400 Enlarged prostate without lower urinary tract symptoms: Secondary | ICD-10-CM

## 2015-09-09 DIAGNOSIS — K219 Gastro-esophageal reflux disease without esophagitis: Secondary | ICD-10-CM

## 2015-09-09 DIAGNOSIS — E785 Hyperlipidemia, unspecified: Secondary | ICD-10-CM

## 2015-09-09 DIAGNOSIS — Z0001 Encounter for general adult medical examination with abnormal findings: Secondary | ICD-10-CM

## 2015-09-09 DIAGNOSIS — I10 Essential (primary) hypertension: Secondary | ICD-10-CM

## 2015-09-09 DIAGNOSIS — Z0000 Encounter for general adult medical examination without abnormal findings: Secondary | ICD-10-CM | POA: Diagnosis not present

## 2015-09-09 DIAGNOSIS — E663 Overweight: Secondary | ICD-10-CM | POA: Insufficient documentation

## 2015-09-09 DIAGNOSIS — Z6826 Body mass index (BMI) 26.0-26.9, adult: Secondary | ICD-10-CM

## 2015-09-09 DIAGNOSIS — Z125 Encounter for screening for malignant neoplasm of prostate: Secondary | ICD-10-CM

## 2015-09-09 DIAGNOSIS — Z79899 Other long term (current) drug therapy: Secondary | ICD-10-CM

## 2015-09-09 DIAGNOSIS — Z9181 History of falling: Secondary | ICD-10-CM

## 2015-09-09 DIAGNOSIS — Z1212 Encounter for screening for malignant neoplasm of rectum: Secondary | ICD-10-CM

## 2015-09-09 LAB — CBC WITH DIFFERENTIAL/PLATELET
Basophils Absolute: 0.1 10*3/uL (ref 0.0–0.1)
Basophils Relative: 1 % (ref 0–1)
Eosinophils Absolute: 0.2 10*3/uL (ref 0.0–0.7)
Eosinophils Relative: 3 % (ref 0–5)
HCT: 42.7 % (ref 39.0–52.0)
Hemoglobin: 14.1 g/dL (ref 13.0–17.0)
Lymphocytes Relative: 26 % (ref 12–46)
Lymphs Abs: 1.7 10*3/uL (ref 0.7–4.0)
MCH: 28.8 pg (ref 26.0–34.0)
MCHC: 33 g/dL (ref 30.0–36.0)
MCV: 87.1 fL (ref 78.0–100.0)
MPV: 9.4 fL (ref 8.6–12.4)
Monocytes Absolute: 0.7 10*3/uL (ref 0.1–1.0)
Monocytes Relative: 11 % (ref 3–12)
Neutro Abs: 3.8 10*3/uL (ref 1.7–7.7)
Neutrophils Relative %: 59 % (ref 43–77)
Platelets: 197 10*3/uL (ref 150–400)
RBC: 4.9 MIL/uL (ref 4.22–5.81)
RDW: 14.2 % (ref 11.5–15.5)
WBC: 6.4 10*3/uL (ref 4.0–10.5)

## 2015-09-09 LAB — LIPID PANEL
Cholesterol: 141 mg/dL (ref 125–200)
HDL: 55 mg/dL (ref >=40)
LDL Cholesterol: 66 mg/dL (ref <130)
Total CHOL/HDL Ratio: 2.6 Ratio (ref <=5.0)
Triglycerides: 98 mg/dL (ref <150)
VLDL: 20 mg/dL (ref <30)

## 2015-09-09 LAB — HEPATIC FUNCTION PANEL
ALT: 24 U/L (ref 9–46)
AST: 26 U/L (ref 10–35)
Albumin: 4 g/dL (ref 3.6–5.1)
Alkaline Phosphatase: 80 U/L (ref 40–115)
Bilirubin, Direct: 0.1 mg/dL (ref <=0.2)
Indirect Bilirubin: 0.3 mg/dL (ref 0.2–1.2)
Total Bilirubin: 0.4 mg/dL (ref 0.2–1.2)
Total Protein: 6.4 g/dL (ref 6.1–8.1)

## 2015-09-09 LAB — BASIC METABOLIC PANEL WITH GFR
BUN: 27 mg/dL — ABNORMAL HIGH (ref 7–25)
CO2: 29 mmol/L (ref 20–31)
Calcium: 9 mg/dL (ref 8.6–10.3)
Chloride: 102 mmol/L (ref 98–110)
Creat: 1.11 mg/dL (ref 0.70–1.18)
GFR, Est African American: 77 mL/min (ref >=60)
GFR, Est Non African American: 66 mL/min (ref >=60)
Glucose, Bld: 88 mg/dL (ref 65–99)
Potassium: 4.3 mmol/L (ref 3.5–5.3)
Sodium: 139 mmol/L (ref 135–146)

## 2015-09-09 LAB — MAGNESIUM: Magnesium: 2 mg/dL (ref 1.5–2.5)

## 2015-09-09 NOTE — Patient Instructions (Signed)

## 2015-09-09 NOTE — Progress Notes (Signed)
Patient ID: Brendan Monroe, male   DOB: 1946/03/27, 69 y.o.   MRN: [id number]  Annual Medicare Screening/Preventative Visit and Comprehensive Examination & Evaluation  This very nice 69 y.o. MWM  presents for presents for a Wellness/Preventative Visit & comprehensive evaluation and management of multiple medical co-morbidities.  Patient has been followed for HTN, Prediabetes, Hyperlipidemia and Vitamin D Deficiency.   HTN predates since 1996. Patient's BP has been controlled at home.Today's BP: 130/82 mmHg. Patient denies any cardiac symptoms as chest pain, palpitations, shortness of breath, dizziness or ankle swelling.   Patient's hyperlipidemia is controlled with diet and medications. Patient denies myalgias or other medication SE's. Last lipids were at goal with  Cholesterol 143; HDL 61; LDL 66; and Triglycerides 81 on 06/19/2015.   Patient has prediabetes since 2010 with A1c 5.9% and patient denies reactive hypoglycemic symptoms, visual blurring, diabetic polys or paresthesias. Last A1c was 5.5% on 06/19/2015. Finally, patient has history of Vitamin D Deficiency of 41 in 2008 and last vitamin D was 06/19/2015: Vit D, 25-Hydroxy 62 on    Medication Sig  . atenolol  25 MG tablet TAKE 1 TABLET EVERY DAY  . VITAMIN D  Take 2,000 Units by mouth 2 (two) times daily.   . Hydrochlorothiazide 25 MG tablet Take 1 tablet (25 mg total) by mouth daily.  Marland Kitchen FISH OIL  Take by mouth daily.  . simvastatin  40 MG tablet Take 1 tablet (40 mg total) by mouth every evening.   No Known Allergies   Past Medical History  Diagnosis Date  . Prediabetes   . Hyperlipidemia   . Hypertension   . GERD (gastroesophageal reflux disease)   . BPH (benign prostatic hyperplasia)   . Vitamin D deficiency    Health Maintenance  Topic Date Due  . Hepatitis C Screening  November 17, 1945  . ZOSTAVAX  04/06/2004  . PNA vac Low Risk Adult (2 of 2 - PCV13) 06/05/2010  . INFLUENZA VACCINE  06/09/2015  . TETANUS/TDAP  01/25/2017  .  COLONOSCOPY  07/24/2023   Immunization History  Administered Date(s) Administered  . Influenza, High Dose Seasonal PF 08/21/2014, 09/09/2015  . Pneumococcal Polysaccharide-23 06/05/2009  . Td 01/26/2007   Past Surgical History  Procedure Laterality Date  . Cerebral aneurysm repair  1996   Family History  Problem Relation Age of Onset  . Alzheimer's disease Mother   . Cancer Father     colon  . Diabetes Sister     Social History   Social History  . Marital Status: Married    Spouse Name: N/A  . Number of Children: N/A  . Years of Education: N/A   Occupational History  . Not on file.   Social History Main Topics  . Smoking status: Former Smoker    Quit date: 04/30/1974  . Smokeless tobacco: Not on file  . Alcohol Use: No  . Drug Use: No  . Sexual Activity: Not on file   Other Topics Concern  . Not on file   Social History Narrative    ROS Constitutional: Denies fever, chills, weight loss/gain, headaches, insomnia,  night sweats or change in appetite. Does c/o fatigue. Eyes: Denies redness, blurred vision, diplopia, discharge, itchy or watery eyes.  ENT: Denies discharge, congestion, post nasal drip, epistaxis, sore throat, earache, hearing loss, dental pain, Tinnitus, Vertigo, Sinus pain or snoring.  Cardio: Denies chest pain, palpitations, irregular heartbeat, syncope, dyspnea, diaphoresis, orthopnea, PND, claudication or edema Respiratory: denies cough, dyspnea, DOE, pleurisy, hoarseness, laryngitis or wheezing.  Gastrointestinal: Denies dysphagia, heartburn, reflux, water brash, pain, cramps, nausea, vomiting, bloating, diarrhea, constipation, hematemesis, melena, hematochezia, jaundice or hemorrhoids Genitourinary: Denies dysuria, frequency, urgency,  discharge, hematuria or flank pain. Has nocturia x 1-3x and mild hesitancy. Musculoskeletal: Denies arthralgia, myalgia, stiffness, Jt. Swelling, pain, limp or strain/sprain. Denies Falls. Skin: Denies puritis,  rash, hives, warts, acne, eczema or change in skin lesion Neuro: No weakness, tremor, incoordination, spasms, paresthesia or pain Psychiatric: Denies confusion, memory loss or sensory loss. Denies Depression. Endocrine: Denies change in weight, skin, hair change, nocturia, and paresthesia, diabetic polys, visual blurring or hyper / hypo glycemic episodes.  Heme/Lymph: No excessive bleeding, bruising or enlarged lymph nodes.  Physical Exam  BP 130/82 mmHg  Pulse 60  Temp(Src) 97 F (36.1 C)  Resp 16  Ht 5' 10.25" (1.784 m)  Wt 186 lb 12.8 oz (84.732 kg)  BMI 26.62 kg/m2  General Appearance: Well nourished, in no apparent distress. Eyes: PERRLA, EOMs, conjunctiva no swelling or erythema, normal fundi and vessels. Sinuses: No frontal/maxillary tenderness ENT/Mouth: EACs patent / TMs  nl. Nares clear without erythema, swelling, mucoid exudates. Oral hygiene is good. No erythema, swelling, or exudate. Tongue normal, non-obstructing. Tonsils not swollen or erythematous. Hearing normal.  Neck: Supple, thyroid normal. No bruits, nodes or JVD. Respiratory: Respiratory effort normal.  BS equal and clear bilateral without rales, rhonci, wheezing or stridor. Cardio: Heart sounds are normal with regular rate and rhythm and no murmurs, rubs or gallops. Peripheral pulses are normal and equal bilaterally without edema. No aortic or femoral bruits. Chest: symmetric with normal excursions and percussion.  Abdomen: Flat, soft, with bowl sounds. Nontender, no guarding, rebound, hernias, masses, or organomegaly.  Lymphatics: Non tender without lymphadenopathy.  Genitourinary: No hernias.Testes nl. DRE - prostate nl for age - smooth & firm w/o nodules. Musculoskeletal: Full ROM all peripheral extremities, joint stability, 5/5 strength, and normal gait. Skin: Warm and dry without rashes, lesions, cyanosis, clubbing or  ecchymosis.  Neuro: Cranial nerves intact, reflexes equal bilaterally. Normal muscle tone,  no cerebellar symptoms. Sensation intact.  Pysch: Awake and oriented X 3 with normal affect, insight and judgment appropriate.   Assessment and Plan  1. Annual Medicare Preventative/Screening Visit   2. Need for prophylactic vaccination and inoculation against influenza  - Flu vaccine HIGH DOSE PF (Fluzone High dose)   3. Essential hypertension  - Microalbumin / creatinine urine ratio - EKG 12-Lead - [place], RETROPERITNL ABD,  LTD - TSH  4. Hyperlipidemia  - Lipid panel  5. Prediabetes  - Hemoglobin A1c - Insulin, random  6. Vitamin D deficiency  - Vit D  25 hydroxy  7. Gastroesophageal reflux disease   8. BPH (benign prostatic hyperplasia)   9. Screening for rectal cancer  - POC Hemoccult Bld/Stl   10. Prostate cancer screening  - PSA  11. Depression screen   12. At low risk for fall   13. BMI 26.0-26.9,adult   14. Medication management  - Urinalysis, Routine w reflex microscopic  - CBC with Differential/Platelet - BASIC METABOLIC PANEL WITH GFR - Hepatic function panel - Magnesium   Continue prudent diet as discussed, weight control, BP monitoring, regular exercise, and medications as discussed.  Discussed med effects and SE's. Routine screening labs and tests as requested with regular follow-up as recommended.

## 2015-09-10 ENCOUNTER — Other Ambulatory Visit: Payer: Self-pay | Admitting: Internal Medicine

## 2015-09-10 DIAGNOSIS — R319 Hematuria, unspecified: Secondary | ICD-10-CM

## 2015-09-10 LAB — URINALYSIS, ROUTINE W REFLEX MICROSCOPIC
Bilirubin Urine: NEGATIVE
Glucose, UA: NEGATIVE
Ketones, ur: NEGATIVE
Leukocytes, UA: NEGATIVE
Nitrite: NEGATIVE
Protein, ur: NEGATIVE
Specific Gravity, Urine: 1.017 (ref 1.001–1.035)
pH: 6.5 (ref 5.0–8.0)

## 2015-09-10 LAB — URINALYSIS, MICROSCOPIC ONLY
Bacteria, UA: NONE SEEN [HPF]
Casts: NONE SEEN [LPF]
Crystals: NONE SEEN [HPF]
RBC / HPF: NONE SEEN RBC/HPF (ref <=2)
Squamous Epithelial / LPF: NONE SEEN [HPF] (ref <=5)
WBC, UA: NONE SEEN WBC/HPF (ref <=5)
Yeast: NONE SEEN [HPF]

## 2015-09-10 LAB — TSH: TSH: 1.599 u[IU]/mL (ref 0.350–4.500)

## 2015-09-10 LAB — INSULIN, RANDOM: Insulin: 10.3 u[IU]/mL (ref 2.0–19.6)

## 2015-09-10 LAB — MICROALBUMIN / CREATININE URINE RATIO
Creatinine, Urine: 70 mg/dL (ref 20–370)
Microalb, Ur: <0.2 mg/dL

## 2015-09-10 LAB — HEMOGLOBIN A1C
Hgb A1c MFr Bld: 5.5 % (ref <5.7)
Mean Plasma Glucose: 111 mg/dL (ref <117)

## 2015-09-10 LAB — PSA: PSA: 1.05 ng/mL (ref <=4.00)

## 2015-09-10 LAB — VITAMIN D 25 HYDROXY (VIT D DEFICIENCY, FRACTURES): Vit D, 25-Hydroxy: 80 ng/mL (ref 30–100)

## 2015-09-15 ENCOUNTER — Ambulatory Visit (INDEPENDENT_AMBULATORY_CARE_PROVIDER_SITE_OTHER): Payer: PPO

## 2015-09-15 DIAGNOSIS — R319 Hematuria, unspecified: Secondary | ICD-10-CM

## 2015-09-15 NOTE — Progress Notes (Signed)
Pt presents for U/A and Urine culture w/no issues today.

## 2015-09-16 LAB — URINALYSIS, MICROSCOPIC ONLY
Bacteria, UA: NONE SEEN [HPF]
Casts: NONE SEEN [LPF]
Crystals: NONE SEEN [HPF]
RBC / HPF: NONE SEEN RBC/HPF (ref <=2)
Squamous Epithelial / LPF: NONE SEEN [HPF] (ref <=5)
WBC, UA: NONE SEEN WBC/HPF (ref <=5)
Yeast: NONE SEEN [HPF]

## 2015-09-16 LAB — URINALYSIS, ROUTINE W REFLEX MICROSCOPIC
Bilirubin Urine: NEGATIVE
Glucose, UA: NEGATIVE
Ketones, ur: NEGATIVE
Leukocytes, UA: NEGATIVE
Nitrite: NEGATIVE
Protein, ur: NEGATIVE
Specific Gravity, Urine: 1.011 (ref 1.001–1.035)
pH: 5 (ref 5.0–8.0)

## 2015-09-17 LAB — URINE CULTURE
Colony Count: NO GROWTH
Organism ID, Bacteria: NO GROWTH

## 2015-09-22 ENCOUNTER — Other Ambulatory Visit: Payer: Self-pay | Admitting: *Deleted

## 2015-09-22 DIAGNOSIS — Z1212 Encounter for screening for malignant neoplasm of rectum: Secondary | ICD-10-CM

## 2015-09-22 LAB — POC HEMOCCULT BLD/STL (HOME/3-CARD/SCREEN)
Card #2 Fecal Occult Blod, POC: NEGATIVE
Card #3 Fecal Occult Blood, POC: NEGATIVE
Fecal Occult Blood, POC: NEGATIVE

## 2015-09-23 ENCOUNTER — Encounter: Payer: Self-pay | Admitting: Internal Medicine

## 2015-11-26 ENCOUNTER — Other Ambulatory Visit: Payer: Self-pay | Admitting: *Deleted

## 2015-11-26 MED ORDER — SIMVASTATIN 40 MG PO TABS: 40.0000 mg | ORAL_TABLET | Freq: Every evening | ORAL | Status: DC

## 2016-01-27 ENCOUNTER — Ambulatory Visit (INDEPENDENT_AMBULATORY_CARE_PROVIDER_SITE_OTHER): Payer: PPO | Admitting: Internal Medicine

## 2016-01-27 ENCOUNTER — Encounter: Payer: Self-pay | Admitting: Internal Medicine

## 2016-01-27 VITALS — BP 122/66 | HR 62 | Temp 98.0°F | Resp 16 | Ht 70.25 in | Wt 180.0 lb

## 2016-01-27 DIAGNOSIS — K219 Gastro-esophageal reflux disease without esophagitis: Secondary | ICD-10-CM

## 2016-01-27 DIAGNOSIS — Z6826 Body mass index (BMI) 26.0-26.9, adult: Secondary | ICD-10-CM | POA: Diagnosis not present

## 2016-01-27 DIAGNOSIS — I10 Essential (primary) hypertension: Secondary | ICD-10-CM

## 2016-01-27 DIAGNOSIS — R6889 Other general symptoms and signs: Secondary | ICD-10-CM

## 2016-01-27 DIAGNOSIS — Z0001 Encounter for general adult medical examination with abnormal findings: Secondary | ICD-10-CM | POA: Diagnosis not present

## 2016-01-27 DIAGNOSIS — E785 Hyperlipidemia, unspecified: Secondary | ICD-10-CM

## 2016-01-27 DIAGNOSIS — N400 Enlarged prostate without lower urinary tract symptoms: Secondary | ICD-10-CM

## 2016-01-27 DIAGNOSIS — E559 Vitamin D deficiency, unspecified: Secondary | ICD-10-CM | POA: Diagnosis not present

## 2016-01-27 DIAGNOSIS — Z0000 Encounter for general adult medical examination without abnormal findings: Secondary | ICD-10-CM

## 2016-01-27 DIAGNOSIS — R7303 Prediabetes: Secondary | ICD-10-CM

## 2016-01-27 DIAGNOSIS — Z79899 Other long term (current) drug therapy: Secondary | ICD-10-CM | POA: Diagnosis not present

## 2016-01-27 MED ORDER — HYDROCHLOROTHIAZIDE 25 MG PO TABS: 25.0000 mg | ORAL_TABLET | Freq: Every day | ORAL | Status: DC

## 2016-01-27 NOTE — Progress Notes (Signed)
MEDICARE ANNUAL WELLNESS VISIT AND FOLLOW UP Assessment:    1. Essential hypertension -cont meds -monitor at home -dash diet -exercise -well controlled  2. Gastroesophageal reflux disease, esophagitis presence not specified -avoid trigger foods -meds prn  3. BPH (benign prostatic hyperplasia) -not currently on meds  4. Prediabetes -cont diet and exercise  5. Hyperlipidemia -diet and exercise -cont meds  6. Vitamin D deficiency -cont supplement  7. Medication management -cont labs at next visit  8. BMI 26.0-26.9,adult -diet and exercise   Over 30 minutes of exam, counseling, chart review, and critical decision making was performed  Plan:   During the course of the visit the patient was educated and counseled about appropriate screening and preventive services including:    Pneumococcal vaccine   Influenza vaccine  Prevnar 13  Td vaccine  Screening electrocardiogram  Colorectal cancer screening  Diabetes screening  Glaucoma screening  Nutrition counseling   Conditions/risks identified: BMI: Discussed weight loss, diet, and increase physical activity.  Increase physical activity: AHA recommends 150 minutes of physical activity a week.  Medications reviewed Diabetes is at goal, ACE/ARB therapy: No, Reason not on Ace Inhibitor/ARB therapy:  at goal Urinary Incontinence is not an issue: discussed non pharmacology and pharmacology options.  Fall risk: low- discussed PT, home fall assessment, medications.    Subjective:  Brendan Monroe is a 70 y.o. male who presents for Medicare Annual Wellness Visit and 3 month follow up for HTN, hyperlipidemia, prediabetes, and vitamin D Def.  Date of last medicare wellness visit was is 2/17.  His blood pressure has been controlled at home, today their BP is BP: 122/66 mmHg He does workout. He denies chest pain, shortness of breath, dizziness.    He is on cholesterol medication and denies myalgias. His  cholesterol is at goal. The cholesterol last visit was:   Lab Results  Component Value Date   CHOL 141 09/09/2015   HDL 55 09/09/2015   LDLCALC 66 09/09/2015   TRIG 98 09/09/2015   CHOLHDL 2.6 09/09/2015   He has been working on diet and exercise for prediabetes, and denies foot ulcerations, hyperglycemia, hypoglycemia , increased appetite, nausea, paresthesia of the feet, polydipsia, polyuria, visual disturbances, vomiting and weight loss. Last A1C in the office was:  Lab Results  Component Value Date   HGBA1C 5.5 09/09/2015   Last GFR NonAA   Lab Results  Component Value Date   [facility] 66 09/09/2015   AA  Lab Results  Component Value Date   GFRAA 77 09/09/2015   Patient is on Vitamin D supplement.   Lab Results  Component Value Date   VD25OH 80 09/09/2015      Medication Review: Current Outpatient Prescriptions on File Prior to Visit  Medication Sig Dispense Refill  . aspirin 81 MG tablet Take 81 mg by mouth daily.    Marland Kitchen atenolol (TENORMIN) 25 MG tablet TAKE 1 TABLET EVERY DAY 90 tablet 2  . Cholecalciferol (VITAMIN D PO) Take 2,000 Units by mouth 2 (two) times daily.     . Omega-3 Fatty Acids (FISH OIL PO) Take by mouth daily.    . simvastatin (ZOCOR) 40 MG tablet Take 1 tablet (40 mg total) by mouth every evening. 90 tablet 2   No current facility-administered medications on file prior to visit.    Current Problems (verified) Patient Active Problem List   Diagnosis Date Noted  . BMI 26.0-26.9,adult 09/09/2015  . Medication management 01/23/2014  . Prediabetes   . Hyperlipidemia   .  Hypertension   . GERD (gastroesophageal reflux disease)   . BPH (benign prostatic hyperplasia)   . Vitamin D deficiency     Screening Tests Immunization History  Administered Date(s) Administered  . Influenza, High Dose Seasonal PF 08/21/2014, 09/09/2015  . Pneumococcal Polysaccharide-23 06/05/2009  . Td 01/26/2007    Preventative care: Last colonoscopy: 2014  Prior  vaccinations: TD or Tdap: 2008 Influenza: 2016  Pneumococcal: 2010 Prevnar13: Declined Shingles/Zostavax:   Names of Other Physician/Practitioners you currently use: 1. [facility] here for primary care 2. Dr. Katy Fitch , eye doctor, last visit  3. Dr. Deboraha Sprang, dentist, last visit 2017  Patient Care Team: Unk Pinto, MD as PCP - General (Internal Medicine) Inda Castle, MD as Consulting Physician (Gastroenterology)  Past Surgical History  Procedure Laterality Date  . Cerebral aneurysm repair  1996   Family History  Problem Relation Age of Onset  . Alzheimer's disease Mother   . Cancer Father     colon  . Diabetes Sister    Social History  Substance Use Topics  . Smoking status: Former Smoker    Quit date: 04/30/1974  . Smokeless tobacco: None  . Alcohol Use: No    MEDICARE WELLNESS OBJECTIVES: Tobacco use: He does not smoke.  Patient is a former smoker. If yes, counseling given Alcohol Current alcohol use: none Osteoporosis: hypogonadism, History of fracture in the past year: no Fall risk: Low Risk Hearing: normal Visual acuity: impaired,  does perform annual eye exam Diet: well balanced Physical activity: Current Exercise Habits: Home exercise routine, Type of exercise: walking, Time (Minutes): 40, Frequency (Times/Week): 5, Weekly Exercise (Minutes/Week): 200, Intensity: Moderate Cardiac risk factors: Cardiac Risk Factors include: hypertension;male gender;dyslipidemia Depression/mood screen:   Depression screen [facility] 2/9 01/27/2016  Decreased Interest 0  Down, Depressed, Hopeless 0  PHQ - 2 Score 0    ADLs:  In your present state of health, do you have any difficulty performing the following activities: 01/27/2016 09/09/2015  Hearing? N N  Vision? N N  Difficulty concentrating or making decisions? N N  Walking or climbing stairs? N N  Dressing or bathing? N N  Doing errands, shopping? N N  Preparing Food and  eating ? N -  Using the Toilet? N -  In the past six months, have you accidently leaked urine? N -  Do you have problems with loss of bowel control? N -  Managing your Medications? N -  Managing your Finances? N -  Housekeeping or managing your Housekeeping? N -     Cognitive Testing  Alert? Yes  Normal Appearance?Yes  Oriented to person? Yes  Place? Yes   Time? Yes  Recall of three objects?  Yes  Can perform simple calculations? Yes  Displays appropriate judgment?Yes  Can read the correct time from a watch face?Yes  EOL planning: Does patient have an advance directive?: Yes Type of Advance Directive: Healthcare Power of Attorney, Living will Does patient want to make changes to advanced directive?: No - Patient declined Copy of advanced directive(s) in chart?: No - copy requested   Objective:   Today's Vitals   01/27/16 1011  BP: 122/66  Pulse: 62  Temp: 98 F (36.7 C)  TempSrc: Temporal  Resp: 16  Height: 5' 10.25" (1.784 m)  Weight: 180 lb (81.647 kg)   Body mass index is 25.65 kg/(m^2).  General appearance: alert, no distress, WD/WN, male HEENT: normocephalic, sclerae anicteric, TMs pearly, nares patent, no discharge or erythema,  pharynx normal Oral cavity: MMM, no lesions Neck: supple, no lymphadenopathy, no thyromegaly, no masses Heart: RRR, normal S1, S2, no murmurs Lungs: CTA bilaterally, no wheezes, rhonchi, or rales Abdomen: +bs, soft, non tender, non distended, no masses, no hepatomegaly, no splenomegaly Musculoskeletal: nontender, no swelling, no obvious deformity Extremities: no edema, no cyanosis, no clubbing Pulses: 2+ symmetric, upper and lower extremities, normal cap refill Neurological: alert, oriented x 3, CN2-12 intact, strength normal upper extremities and lower extremities, sensation normal throughout, DTRs 2+ throughout, no cerebellar signs, gait normal Psychiatric: normal affect, behavior normal, pleasant   Medicare Attestation I have  personally reviewed: The patient's medical and social history Their use of alcohol, tobacco or illicit drugs Their current medications and supplements The patient's functional ability including ADLs,fall risks, home safety risks, cognitive, and hearing and visual impairment Diet and physical activities Evidence for depression or mood disorders  The patient's weight, height, BMI, and visual acuity have been recorded in the chart.  I have made referrals, counseling, and provided education to the patient based on review of the above and I have provided the patient with a written personalized care plan for preventive services.     Starlyn Skeans, PA-C   01/27/2016

## 2016-02-05 ENCOUNTER — Ambulatory Visit: Payer: Self-pay | Admitting: Internal Medicine

## 2016-03-10 ENCOUNTER — Other Ambulatory Visit: Payer: Self-pay | Admitting: *Deleted

## 2016-03-10 MED ORDER — HYDROCHLOROTHIAZIDE 25 MG PO TABS: 25.0000 mg | ORAL_TABLET | Freq: Every day | ORAL | Status: DC

## 2016-05-06 ENCOUNTER — Encounter: Payer: Self-pay | Admitting: Internal Medicine

## 2016-05-06 ENCOUNTER — Ambulatory Visit (INDEPENDENT_AMBULATORY_CARE_PROVIDER_SITE_OTHER): Payer: PPO | Admitting: Internal Medicine

## 2016-05-06 VITALS — BP 116/74 | HR 60 | Temp 97.5°F | Resp 16 | Ht 70.25 in | Wt 192.0 lb

## 2016-05-06 DIAGNOSIS — Z79899 Other long term (current) drug therapy: Secondary | ICD-10-CM | POA: Diagnosis not present

## 2016-05-06 DIAGNOSIS — R7303 Prediabetes: Secondary | ICD-10-CM | POA: Diagnosis not present

## 2016-05-06 DIAGNOSIS — K219 Gastro-esophageal reflux disease without esophagitis: Secondary | ICD-10-CM

## 2016-05-06 DIAGNOSIS — E785 Hyperlipidemia, unspecified: Secondary | ICD-10-CM | POA: Diagnosis not present

## 2016-05-06 DIAGNOSIS — E559 Vitamin D deficiency, unspecified: Secondary | ICD-10-CM

## 2016-05-06 DIAGNOSIS — I10 Essential (primary) hypertension: Secondary | ICD-10-CM

## 2016-05-06 LAB — CBC WITH DIFFERENTIAL/PLATELET
Basophils Absolute: 54 cells/uL (ref 0–200)
Basophils Relative: 1 %
Eosinophils Absolute: 216 cells/uL (ref 15–500)
Eosinophils Relative: 4 %
HCT: 41.7 % (ref 38.5–50.0)
Hemoglobin: 14.1 g/dL (ref 13.2–17.1)
Lymphocytes Relative: 29 %
Lymphs Abs: 1566 cells/uL (ref 850–3900)
MCH: 29.3 pg (ref 27.0–33.0)
MCHC: 33.8 g/dL (ref 32.0–36.0)
MCV: 86.5 fL (ref 80.0–100.0)
MPV: 9.5 fL (ref 7.5–12.5)
Monocytes Absolute: 540 cells/uL (ref 200–950)
Monocytes Relative: 10 %
Neutro Abs: 3024 cells/uL (ref 1500–7800)
Neutrophils Relative %: 56 %
Platelets: 205 10*3/uL (ref 140–400)
RBC: 4.82 MIL/uL (ref 4.20–5.80)
RDW: 14.6 % (ref 11.0–15.0)
WBC: 5.4 10*3/uL (ref 3.8–10.8)

## 2016-05-06 LAB — HEPATIC FUNCTION PANEL
ALT: 18 U/L (ref 9–46)
AST: 26 U/L (ref 10–35)
Albumin: 3.8 g/dL (ref 3.6–5.1)
Alkaline Phosphatase: 75 U/L (ref 40–115)
Bilirubin, Direct: 0.2 mg/dL (ref <=0.2)
Indirect Bilirubin: 0.6 mg/dL (ref 0.2–1.2)
Total Bilirubin: 0.8 mg/dL (ref 0.2–1.2)
Total Protein: 6.3 g/dL (ref 6.1–8.1)

## 2016-05-06 LAB — BASIC METABOLIC PANEL WITH GFR
BUN: 21 mg/dL (ref 7–25)
CO2: 30 mmol/L (ref 20–31)
Calcium: 9.2 mg/dL (ref 8.6–10.3)
Chloride: 100 mmol/L (ref 98–110)
Creat: 1.1 mg/dL (ref 0.70–1.18)
GFR, Est African American: 77 mL/min (ref >=60)
GFR, Est Non African American: 67 mL/min (ref >=60)
Glucose, Bld: 92 mg/dL (ref 65–99)
Potassium: 3.8 mmol/L (ref 3.5–5.3)
Sodium: 138 mmol/L (ref 135–146)

## 2016-05-06 LAB — LIPID PANEL
Cholesterol: 140 mg/dL (ref 125–200)
HDL: 61 mg/dL (ref >=40)
LDL Cholesterol: 58 mg/dL (ref <130)
Total CHOL/HDL Ratio: 2.3 Ratio (ref <=5.0)
Triglycerides: 103 mg/dL (ref <150)
VLDL: 21 mg/dL (ref <30)

## 2016-05-06 LAB — TSH: TSH: 2.63 mIU/L (ref 0.40–4.50)

## 2016-05-06 LAB — MAGNESIUM: Magnesium: 2 mg/dL (ref 1.5–2.5)

## 2016-05-06 NOTE — Patient Instructions (Signed)

## 2016-05-06 NOTE — Progress Notes (Signed)
Patient ID: Brendan Monroe, male   DOB: October 03, 1946, 70 y.o.   MRN: [id number]  [facility]                       Unk Pinto, M.D.        Uvaldo Bristle. Silverio Lay, P.A.-C       Starlyn Skeans, P.A.-C   [facility]                [address] [place], [place] [id number] Telephone [phone] Telefax [phone] ______________________________________________________________________     This very nice 70 y.o. MWM presents for 6 month follow up with Hypertension, Hyperlipidemia, Pre-Diabetes and Vitamin D Deficiency.      Patient is treated for HTN circa 1996 & BP has been controlled at home. Today's BP is 116/74 mmHg. Patient has had no complaints of any cardiac type chest pain, palpitations, dyspnea/orthopnea/PND, dizziness, claudication, or dependent edema.     Hyperlipidemia is controlled with diet & meds. Patient denies myalgias or other med SE's. Last Lipids were at goal with Cholesterol 141; HDL 55; LDL 66; Triglycerides 98 on 09/09/2015.     Also, the patient has history of PreDiabetes from 2010 with A1c 5.9% and he has had no symptoms of reactive hypoglycemia, diabetic polys, paresthesias or visual blurring.  Last A1c was at goal with A1c 5.5% on11/11/2014.     Lastly, the patient also has history of Vitamin D Deficiency of 41 on treatment in 2008 and supplements vitamin D without any suspected side-effects. Last vitamin D was at goal with 80 on 09/09/2015.    Medication Sig  . aspirin 81 MG  Take 81 mg by mouth daily.  Marland Kitchen atenolol  25 MG TAKE 1 TABLET EVERY DAY - takes 1/2 tab  . VITAMIN D Take 2,000 Units by mouth 2 (two) times daily.   . hctz  25 MG Take 1 tablet (25 mg total) by mouth daily.- takes 1/2 tab  . Omega-3 FISH OIL  Take by mouth daily.  . simvastatin  40 MG Take 1 tablet (40 mg total) by mouth every evening. - takes 1/2 tab   No Known Allergies  PMHx:   Past Medical History   Diagnosis Date  . Prediabetes   . Hyperlipidemia   . Hypertension   . GERD (gastroesophageal reflux disease)   . BPH (benign prostatic hyperplasia)   . Vitamin D deficiency    Immunization History  Administered Date(s) Administered  . Influenza, High Dose Seasonal PF 08/21/2014, 09/09/2015  . Pneumococcal Polysaccharide-23 06/05/2009  . Td 01/26/2007   Past Surgical History  Procedure Laterality Date  . Cerebral aneurysm repair  1996   FHx:    Reviewed / unchanged  SHx:    Reviewed / unchanged  Systems Review:  Constitutional: Denies fever, chills, wt changes, headaches, insomnia, fatigue, night sweats, change in appetite. Eyes: Denies redness, blurred vision, diplopia, discharge, itchy, watery eyes.  ENT: Denies discharge, congestion, post nasal drip, epistaxis, sore throat, earache, hearing loss, dental pain, tinnitus, vertigo, sinus pain, snoring.  CV: Denies chest pain, palpitations, irregular heartbeat, syncope, dyspnea, diaphoresis, orthopnea, PND, claudication or edema. Respiratory: denies cough, dyspnea, DOE, pleurisy, hoarseness, laryngitis, wheezing.  Gastrointestinal: Denies dysphagia, odynophagia, heartburn, reflux, water brash, abdominal pain or cramps, nausea, vomiting, bloating, diarrhea, constipation, hematemesis, melena, hematochezia  or hemorrhoids. Genitourinary: Denies dysuria, frequency, urgency,  nocturia, hesitancy, discharge, hematuria or flank pain. Musculoskeletal: Denies arthralgias, myalgias, stiffness, jt. swelling, pain, limping or strain/sprain.  Skin: Denies pruritus, rash, hives, warts, acne, eczema or change in skin lesion(s). Neuro: No weakness, tremor, incoordination, spasms, paresthesia or pain. Psychiatric: Denies confusion, memory loss or sensory loss. Endo: Denies change in weight, skin or hair change.  Heme/Lymph: No excessive bleeding, bruising or enlarged lymph nodes.  Physical Exam  BP 116/74 mmHg  Pulse 60  Temp(Src) 97.5 F (36.4  C)  Resp 16  Ht 5' 10.25" (1.784 m)  Wt 192 lb (87.091 kg)  BMI 27.36 kg/m2  Appears well nourished and in no distress. Eyes: PERRLA, EOMs, conjunctiva no swelling or erythema. Sinuses: No frontal/maxillary tenderness ENT/Mouth: EAC's clear, TM's nl w/o erythema, bulging. Nares clear w/o erythema, swelling, exudates. Oropharynx clear without erythema or exudates. Oral hygiene is good. Tongue normal, non obstructing. Hearing intact.  Neck: Supple. Thyroid nl. Car 2+/2+ without bruits, nodes or JVD. Chest: Respirations nl with BS clear & equal w/o rales, rhonchi, wheezing or stridor.  Cor: Heart sounds normal w/ regular rate and rhythm without sig. murmurs, gallops, clicks, or rubs. Peripheral pulses normal and equal  without edema.  Abdomen: Soft & bowel sounds normal. Non-tender w/o guarding, rebound, hernias, masses, or organomegaly.  Lymphatics: Unremarkable.  Musculoskeletal: Full ROM all peripheral extremities, joint stability, 5/5 strength, and normal gait.  Skin: Warm, dry without exposed rashes, lesions or ecchymosis apparent.  Neuro: Cranial nerves intact, reflexes equal bilaterally. Sensory-motor testing grossly intact. Tendon reflexes grossly intact.  Pysch: Alert & oriented x 3.  Insight and judgement nl & appropriate. No ideations.  Assessment and Plan:  1. Essential hypertension  - Continue monitor blood pressure at home. Continue diet/meds same. - TSH  2. Hyperlipidemia  - Continue diet/meds, exercise,& lifestyle modifications. Continue monitor periodic cholesterol/liver & renal functions  - Lipid panel - TSH  3. Prediabetes  - Continue diet, exercise, lifestyle modifications. Monitor appropriate labs. - Hemoglobin A1c - Insulin, random  4. Vitamin D deficiency  - Continue supplementation. - VITAMIN D 25 Hydroxy   5. Gastroesophageal reflux disease, esophagitis presence not specified   6. Medication management  - CBC with Differential/Platelet - BASIC  METABOLIC PANEL WITH GFR - Hepatic function panel - Magnesium   Recommended regular exercise, BP monitoring, weight control, and discussed med and SE's. Recommended labs to assess and monitor clinical status. Further disposition pending results of labs. Over 30 minutes of exam, counseling, chart review was performed

## 2016-05-07 ENCOUNTER — Other Ambulatory Visit: Payer: Self-pay | Admitting: *Deleted

## 2016-05-07 LAB — VITAMIN D 25 HYDROXY (VIT D DEFICIENCY, FRACTURES): Vit D, 25-Hydroxy: 72 ng/mL (ref 30–100)

## 2016-05-07 LAB — HEMOGLOBIN A1C
Hgb A1c MFr Bld: 5.3 % (ref <5.7)
Mean Plasma Glucose: 105 mg/dL

## 2016-05-07 LAB — INSULIN, RANDOM: Insulin: 16.5 u[IU]/mL (ref 2.0–19.6)

## 2016-05-07 MED ORDER — ATENOLOL 25 MG PO TABS: 25.0000 mg | ORAL_TABLET | Freq: Every day | ORAL | Status: DC

## 2016-07-06 ENCOUNTER — Ambulatory Visit (INDEPENDENT_AMBULATORY_CARE_PROVIDER_SITE_OTHER): Payer: PPO | Admitting: *Deleted

## 2016-07-06 DIAGNOSIS — Z23 Encounter for immunization: Secondary | ICD-10-CM

## 2016-08-18 ENCOUNTER — Ambulatory Visit: Payer: Self-pay | Admitting: Physician Assistant

## 2016-08-25 ENCOUNTER — Encounter: Payer: Self-pay | Admitting: Physician Assistant

## 2016-08-25 ENCOUNTER — Ambulatory Visit (INDEPENDENT_AMBULATORY_CARE_PROVIDER_SITE_OTHER): Payer: PPO | Admitting: Physician Assistant

## 2016-08-25 VITALS — BP 122/84 | HR 66 | Temp 97.7°F | Resp 16 | Ht 70.25 in | Wt 191.0 lb

## 2016-08-25 DIAGNOSIS — E559 Vitamin D deficiency, unspecified: Secondary | ICD-10-CM

## 2016-08-25 DIAGNOSIS — I10 Essential (primary) hypertension: Secondary | ICD-10-CM

## 2016-08-25 DIAGNOSIS — Z79899 Other long term (current) drug therapy: Secondary | ICD-10-CM | POA: Diagnosis not present

## 2016-08-25 DIAGNOSIS — R7303 Prediabetes: Secondary | ICD-10-CM | POA: Diagnosis not present

## 2016-08-25 DIAGNOSIS — E785 Hyperlipidemia, unspecified: Secondary | ICD-10-CM | POA: Diagnosis not present

## 2016-08-25 LAB — HEPATIC FUNCTION PANEL
ALT: 17 U/L (ref 9–46)
AST: 25 U/L (ref 10–35)
Albumin: 3.9 g/dL (ref 3.6–5.1)
Alkaline Phosphatase: 75 U/L (ref 40–115)
Bilirubin, Direct: 0.1 mg/dL (ref <=0.2)
Indirect Bilirubin: 0.5 mg/dL (ref 0.2–1.2)
Total Bilirubin: 0.6 mg/dL (ref 0.2–1.2)
Total Protein: 6.2 g/dL (ref 6.1–8.1)

## 2016-08-25 LAB — BASIC METABOLIC PANEL WITH GFR
BUN: 22 mg/dL (ref 7–25)
CO2: 30 mmol/L (ref 20–31)
Calcium: 9.5 mg/dL (ref 8.6–10.3)
Chloride: 102 mmol/L (ref 98–110)
Creat: 1.06 mg/dL (ref 0.70–1.18)
GFR, Est African American: 81 mL/min (ref >=60)
GFR, Est Non African American: 70 mL/min (ref >=60)
Glucose, Bld: 81 mg/dL (ref 65–99)
Potassium: 4.1 mmol/L (ref 3.5–5.3)
Sodium: 141 mmol/L (ref 135–146)

## 2016-08-25 LAB — LIPID PANEL
Cholesterol: 177 mg/dL (ref 125–200)
HDL: 62 mg/dL (ref >=40)
LDL Cholesterol: 94 mg/dL (ref <130)
Total CHOL/HDL Ratio: 2.9 Ratio (ref <=5.0)
Triglycerides: 103 mg/dL (ref <150)
VLDL: 21 mg/dL (ref <30)

## 2016-08-25 LAB — CBC WITH DIFFERENTIAL/PLATELET
Basophils Absolute: 60 cells/uL (ref 0–200)
Basophils Relative: 1 %
Eosinophils Absolute: 180 cells/uL (ref 15–500)
Eosinophils Relative: 3 %
HCT: 41 % (ref 38.5–50.0)
Hemoglobin: 13.6 g/dL (ref 13.2–17.1)
Lymphocytes Relative: 28 %
Lymphs Abs: 1680 cells/uL (ref 850–3900)
MCH: 29.3 pg (ref 27.0–33.0)
MCHC: 33.2 g/dL (ref 32.0–36.0)
MCV: 88.4 fL (ref 80.0–100.0)
MPV: 9.1 fL (ref 7.5–12.5)
Monocytes Absolute: 600 cells/uL (ref 200–950)
Monocytes Relative: 10 %
Neutro Abs: 3480 cells/uL (ref 1500–7800)
Neutrophils Relative %: 58 %
Platelets: 233 10*3/uL (ref 140–400)
RBC: 4.64 MIL/uL (ref 4.20–5.80)
RDW: 14.8 % (ref 11.0–15.0)
WBC: 6 10*3/uL (ref 3.8–10.8)

## 2016-08-25 LAB — TSH: TSH: 3.23 mIU/L (ref 0.40–4.50)

## 2016-08-25 NOTE — Progress Notes (Signed)
Assessment and Plan:   Hypertension -Continue medication, monitor blood pressure at home. Continue DASH diet.  Reminder to go to the ER if any CP, SOB, nausea, dizziness, severe HA, changes vision/speech, left arm numbness and tingling and jaw pain.  Cholesterol -Continue diet and exercise. Check cholesterol.    Prediabetes  -Continue diet and exercise. Check A1C  Vitamin D Def - check level and continue medications.   Continue diet and meds as discussed. Further disposition pending results of labs. Over 30 minutes of exam, counseling, chart review, and critical decision making was performed  Future Appointments Date Time Provider [place]  11/30/2016 11:15 AM Unk Pinto, MD [facility] None     HPI 70 y.o. male  presents for 3 month follow up on hypertension, cholesterol, prediabetes, and vitamin D deficiency.   His blood pressure has been controlled at home, today their BP is BP: 122/84  He does not workout. He denies chest pain, shortness of breath, dizziness.  He is on cholesterol medication and denies myalgias. His cholesterol is at goal. The cholesterol last visit was:   Lab Results  Component Value Date   CHOL 140 05/06/2016   HDL 61 05/06/2016   LDLCALC 58 05/06/2016   TRIG 103 05/06/2016   CHOLHDL 2.3 05/06/2016    He has been working on diet and exercise for prediabetes, and denies paresthesia of the feet, polydipsia, polyuria and visual disturbances. Last A1C in the office was:  Lab Results  Component Value Date   HGBA1C 5.3 05/06/2016   Patient is on Vitamin D supplement.   Lab Results  Component Value Date   VD25OH 72 05/06/2016       Current Medications:  Current Outpatient Prescriptions on File Prior to Visit  Medication Sig Dispense Refill  . aspirin 81 MG tablet Take 81 mg by mouth daily.    Marland Kitchen atenolol (TENORMIN) 25 MG tablet Take 1 tablet (25 mg total) by mouth daily. 90 tablet 2  . Cholecalciferol (VITAMIN D PO) Take 2,000 Units by  mouth 2 (two) times daily.     . hydrochlorothiazide (HYDRODIURIL) 25 MG tablet Take 1 tablet (25 mg total) by mouth daily. 90 tablet 1  . Omega-3 Fatty Acids (FISH OIL PO) Take by mouth daily.    . simvastatin (ZOCOR) 40 MG tablet Take 1 tablet (40 mg total) by mouth every evening. 90 tablet 2   No current facility-administered medications on file prior to visit.    Medical History:  Past Medical History:  Diagnosis Date  . BPH (benign prostatic hyperplasia)   . GERD (gastroesophageal reflux disease)   . Hyperlipidemia   . Hypertension   . Prediabetes   . Vitamin D deficiency    Allergies: No Known Allergies   Review of Systems:  ROS  Family history- Review and unchanged Social history- Review and unchanged Physical Exam: BP 122/84   Pulse 66   Temp 97.7 F (36.5 C)   Resp 16   Ht 5' 10.25" (1.784 m)   Wt 191 lb (86.6 kg)   SpO2 98%   BMI 27.21 kg/m  Wt Readings from Last 3 Encounters:  08/25/16 191 lb (86.6 kg)  05/06/16 192 lb (87.1 kg)  01/27/16 180 lb (81.6 kg)   General Appearance: Well nourished, in no apparent distress. Eyes: PERRLA, EOMs, conjunctiva no swelling or erythema Sinuses: No Frontal/maxillary tenderness ENT/Mouth: Ext aud canals clear, TMs without erythema, bulging. No erythema, swelling, or exudate on post pharynx.  Tonsils not swollen or erythematous.  Hearing normal.  Neck: Supple, thyroid normal.  Respiratory: Respiratory effort normal, BS equal bilaterally without rales, rhonchi, wheezing or stridor.  Cardio: RRR with no MRGs. Brisk peripheral pulses without edema.  Abdomen: Soft, + BS,  Non tender, no guarding, rebound, hernias, masses. Lymphatics: Non tender without lymphadenopathy.  Musculoskeletal: Full ROM, 5/5 strength, Normal gait Skin: Warm, dry without rashes, lesions, ecchymosis.  Neuro: Cranial nerves intact. Normal muscle tone, no cerebellar symptoms. Psych: Awake and oriented X 3, normal affect, Insight and Judgment appropriate.     Vicie Mutters, PA-C 10:46 AM [facility]

## 2016-08-30 ENCOUNTER — Ambulatory Visit: Payer: Self-pay | Admitting: Physician Assistant

## 2016-10-12 ENCOUNTER — Encounter: Payer: Self-pay | Admitting: Internal Medicine

## 2016-11-30 ENCOUNTER — Ambulatory Visit (INDEPENDENT_AMBULATORY_CARE_PROVIDER_SITE_OTHER): Payer: PPO | Admitting: Internal Medicine

## 2016-11-30 ENCOUNTER — Encounter: Payer: Self-pay | Admitting: Internal Medicine

## 2016-11-30 VITALS — BP 112/76 | HR 64 | Temp 97.9°F | Resp 16 | Ht 70.75 in | Wt 185.8 lb

## 2016-11-30 DIAGNOSIS — Z125 Encounter for screening for malignant neoplasm of prostate: Secondary | ICD-10-CM | POA: Diagnosis not present

## 2016-11-30 DIAGNOSIS — Z0001 Encounter for general adult medical examination with abnormal findings: Secondary | ICD-10-CM

## 2016-11-30 DIAGNOSIS — Z136 Encounter for screening for cardiovascular disorders: Secondary | ICD-10-CM

## 2016-11-30 DIAGNOSIS — E782 Mixed hyperlipidemia: Secondary | ICD-10-CM

## 2016-11-30 DIAGNOSIS — E559 Vitamin D deficiency, unspecified: Secondary | ICD-10-CM

## 2016-11-30 DIAGNOSIS — Z1212 Encounter for screening for malignant neoplasm of rectum: Secondary | ICD-10-CM

## 2016-11-30 DIAGNOSIS — Z79899 Other long term (current) drug therapy: Secondary | ICD-10-CM

## 2016-11-30 DIAGNOSIS — Z0000 Encounter for general adult medical examination without abnormal findings: Secondary | ICD-10-CM

## 2016-11-30 DIAGNOSIS — K219 Gastro-esophageal reflux disease without esophagitis: Secondary | ICD-10-CM

## 2016-11-30 DIAGNOSIS — I10 Essential (primary) hypertension: Secondary | ICD-10-CM

## 2016-11-30 DIAGNOSIS — R7303 Prediabetes: Secondary | ICD-10-CM

## 2016-11-30 DIAGNOSIS — N400 Enlarged prostate without lower urinary tract symptoms: Secondary | ICD-10-CM | POA: Diagnosis not present

## 2016-11-30 LAB — CBC WITH DIFFERENTIAL/PLATELET
Basophils Absolute: 56 cells/uL (ref 0–200)
Basophils Relative: 1 %
Eosinophils Absolute: 168 cells/uL (ref 15–500)
Eosinophils Relative: 3 %
HCT: 43.4 % (ref 38.5–50.0)
Hemoglobin: 14.5 g/dL (ref 13.2–17.1)
Lymphocytes Relative: 33 %
Lymphs Abs: 1848 cells/uL (ref 850–3900)
MCH: 28.7 pg (ref 27.0–33.0)
MCHC: 33.4 g/dL (ref 32.0–36.0)
MCV: 85.9 fL (ref 80.0–100.0)
MPV: 9.6 fL (ref 7.5–12.5)
Monocytes Absolute: 560 cells/uL (ref 200–950)
Monocytes Relative: 10 %
Neutro Abs: 2968 cells/uL (ref 1500–7800)
Neutrophils Relative %: 53 %
Platelets: 255 10*3/uL (ref 140–400)
RBC: 5.05 MIL/uL (ref 4.20–5.80)
RDW: 14.6 % (ref 11.0–15.0)
WBC: 5.6 10*3/uL (ref 3.8–10.8)

## 2016-11-30 LAB — TSH: TSH: 2.44 mIU/L (ref 0.40–4.50)

## 2016-11-30 LAB — PSA: PSA: 1.3 ng/mL (ref <=4.0)

## 2016-11-30 NOTE — Patient Instructions (Signed)

## 2016-11-30 NOTE — Progress Notes (Signed)
[facility]   Unk Pinto, M.D.    Uvaldo Bristle. Silverio Lay, P.A.-C      Starlyn Skeans, P.A.-C  [facility]                [address] [place] [id number] Telephone [phone] Telefax [phone] Annual  Screening/Preventative Visit  & Comprehensive Evaluation & Examination     This very nice 71 y.o.  MWM presents for a Screening/Preventative Visit & comprehensive evaluation and management of multiple medical co-morbidities.  Patient has been followed for HTN, Prediabetes, Hyperlipidemia and Vitamin D Deficiency.     HTN predates since 1996. Patient's BP has been controlled at home.  Today's BP is at goal - 112/76. Patient denies any cardiac symptoms as chest pain, palpitations, shortness of breath, dizziness or ankle swelling.     Patient's hyperlipidemia is controlled with diet and medications. Patient denies myalgias or other medication SE's. Last lipids were at goal: Lab Results  Component Value Date   CHOL 177 08/25/2016   HDL 62 08/25/2016   LDLCALC 94 08/25/2016   TRIG 103 08/25/2016   CHOLHDL 2.9 08/25/2016      Patient has prediabetes  With A1c 5.9% in 2010 and patient denies reactive hypoglycemic symptoms, visual blurring, diabetic polys or paresthesias. Last A1c was at goal: Lab Results  Component Value Date   HGBA1C 5.3 05/06/2016       Finally, patient has history of Vitamin D Deficiency in 2008 of "41" and last vitamin D was at goal: Lab Results  Component Value Date   VD25OH 72 05/06/2016   Current Outpatient Prescriptions on File Prior to Visit  Medication Sig  . aspirin 81 MG tablet Take 81 mg by mouth daily.  Marland Kitchen atenolol (TENORMIN) 25 MG tablet Take 1 tablet (25 mg total) by mouth daily.  . Cholecalciferol (VITAMIN D PO) Take 2,000 Units by mouth 2 (two) times daily.   . hydrochlorothiazide (HYDRODIURIL) 25 MG tablet Take 1 tablet (25 mg total) by mouth daily.  .  Omega-3 Fatty Acids (FISH OIL PO) Take by mouth daily.  . simvastatin (ZOCOR) 40 MG tablet Take 1 tablet (40 mg total) by mouth every evening.   No current facility-administered medications on file prior to visit.    No Known Allergies Past Medical History:  Diagnosis Date  . BPH (benign prostatic hyperplasia)   . GERD (gastroesophageal reflux disease)   . Hyperlipidemia   . Hypertension   . Prediabetes   . Vitamin D deficiency    Health Maintenance  Topic Date Due  . ZOSTAVAX  11/08/2018 (Originally 04/06/2004)  . Hepatitis C Screening  11/08/2018 (Originally 08/14/1946)  . PNA vac Low Risk Adult (2 of 2 - PCV13) 11/08/2018 (Originally 06/05/2010)  . TETANUS/TDAP  01/25/2017  . COLONOSCOPY  07/24/2023  . INFLUENZA VACCINE  Completed   Immunization History  Administered Date(s) Administered  . Influenza, High Dose Seasonal PF 08/21/2014, 09/09/2015, 07/06/2016  . Pneumococcal Polysaccharide-23 06/05/2009  . Td 01/26/2007   Past Surgical History:  Procedure Laterality Date  . CEREBRAL ANEURYSM REPAIR  1996   Family History  Problem Relation Age of Onset  . Alzheimer's disease Mother   . Cancer Father     colon  . Diabetes Sister    Social History   Social History  . Marital status: Married    Spouse name: N/A  .  Number of children: N/A  . Years of education: N/A   Occupational History  .  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 Smoking status: Former Smoker    Quit date: 04/30/1974  . Smokeless tobacco: Not on file  . Alcohol use No  . Drug use: No  . Sexual activity: Not on file    ROS Constitutional: Denies fever, chills, weight loss/gain, headaches, insomnia,  night sweats or change in appetite. Does c/o fatigue. Eyes: Denies redness, blurred vision, diplopia, discharge, itchy or watery eyes.  ENT: Denies discharge, congestion, post nasal drip, epistaxis, sore throat, earache, hearing loss, dental pain, Tinnitus,  Vertigo, Sinus pain or snoring.  Cardio: Denies chest pain, palpitations, irregular heartbeat, syncope, dyspnea, diaphoresis, orthopnea, PND, claudication or edema Respiratory: denies cough, dyspnea, DOE, pleurisy, hoarseness, laryngitis or wheezing.  Gastrointestinal: Denies dysphagia, heartburn, reflux, water brash, pain, cramps, nausea, vomiting, bloating, diarrhea, constipation, hematemesis, melena, hematochezia, jaundice or hemorrhoids Genitourinary: Denies dysuria, frequency, urgency, nocturia, hesitancy, discharge, hematuria or flank pain Musculoskeletal: Denies arthralgia, myalgia, stiffness, Jt. Swelling, pain, limp or strain/sprain. Denies Falls. Skin: Denies puritis, rash, hives, warts, acne, eczema or change in skin lesion Neuro: No weakness, tremor, incoordination, spasms, paresthesia or pain Psychiatric: Denies confusion, memory loss or sensory loss. Denies Depression. Endocrine: Denies change in weight, skin, hair change, nocturia, and paresthesia, diabetic polys, visual blurring or hyper / hypo glycemic episodes.  Heme/Lymph: No excessive bleeding, bruising or enlarged lymph nodes.  Physical Exam  BP 112/76   Pulse 64   Temp 97.9 F (36.6 C)   Resp 16   Ht 5' 10.75" (1.797 m)   Wt 185 lb 12.8 oz (84.3 kg)   BMI 26.10 kg/m   General Appearance: Well nourished, in no apparent distress.  Eyes: PERRLA, EOMs, conjunctiva no swelling or erythema, normal fundi and vessels. Sinuses: No frontal/maxillary tenderness ENT/Mouth: EACs patent / TMs  nl. Nares clear without erythema, swelling, mucoid exudates. Oral hygiene is good. No erythema, swelling, or exudate. Tongue normal, non-obstructing. Tonsils not swollen or erythematous. Hearing normal.  Neck: Supple, thyroid normal. No bruits, nodes or JVD. Respiratory: Respiratory effort normal.  BS equal and clear bilateral without rales, rhonci, wheezing or stridor. Cardio: Heart sounds are normal with regular rate and rhythm and no  murmurs, rubs or gallops. Peripheral pulses are normal and equal bilaterally without edema. No aortic or femoral bruits. Chest: symmetric with normal excursions and percussion.  Abdomen: Soft, with Nl bowel sounds. Nontender, no guarding, rebound, hernias, masses, or organomegaly.  Lymphatics: Non tender without lymphadenopathy.  Genitourinary: No hernias.Testes nl. DRE - 2+ enlarged  - smooth & firm w/o nodules. Musculoskeletal: Full ROM all peripheral extremities, joint stability, 5/5 strength, and normal gait. Skin: Warm and dry without rashes, lesions, cyanosis, clubbing or  ecchymosis.  Neuro: Cranial nerves intact, reflexes equal bilaterally. Normal muscle tone, no cerebellar symptoms. Sensation intact.  Pysch: Alert and oriented X 3 with normal affect, insight and judgment appropriate.   Assessment and Plan  1. Annual Preventative/Screening Exam     2. Essential hypertension  - Microalbumin / creatinine urine ratio - EKG 12-Lead - [place], RETROPERITNL ABD,  LTD - Urinalysis, Routine w reflex microscopic - CBC with Differential/Platelet - BASIC METABOLIC PANEL WITH GFR - TSH  3. Mixed hyperlipidemia  - EKG 12-Lead - [place], RETROPERITNL ABD,  LTD - Hepatic function panel - Lipid panel  4. Prediabetes  - EKG 12-Lead - [place], RETROPERITNL ABD,  LTD - Hemoglobin A1c - Insulin, random  5. Vitamin D deficiency  - VITAMIN D 25 Hydroxy   6. Gastroesophageal reflux disease   7. Screening for rectal cancer  - POC Hemoccult Bld/Stl  8. Prostate cancer screening  - PSA  9. Benign prostatic hyperplasia  - PSA  10. Screening for ischemic heart disease  - EKG 12-Lead  11. Screening for AAA (aortic abdominal aneurysm)  - [place], RETROPERITNL ABD,  LTD  12. Medication management  - Urinalysis, Routine w reflex microscopic - CBC with Differential/Platelet - BASIC METABOLIC PANEL WITH GFR - Hepatic function panel - Magnesium       Continue prudent diet as discussed,  weight control, BP monitoring, regular exercise, and medications as discussed.  Discussed med effects and SE's. Routine screening labs and tests as requested with regular follow-up as recommended. Over 40 minutes of exam, counseling, chart review and high complex critical decision making was performed

## 2016-12-01 LAB — BASIC METABOLIC PANEL WITH GFR
BUN: 21 mg/dL (ref 7–25)
CO2: 22 mmol/L (ref 20–31)
Calcium: 9.5 mg/dL (ref 8.6–10.3)
Chloride: 101 mmol/L (ref 98–110)
Creat: 1.29 mg/dL — ABNORMAL HIGH (ref 0.70–1.18)
GFR, Est African American: 64 mL/min (ref >=60)
GFR, Est Non African American: 55 mL/min — ABNORMAL LOW (ref >=60)
Glucose, Bld: 77 mg/dL (ref 65–99)
Potassium: 4 mmol/L (ref 3.5–5.3)
Sodium: 140 mmol/L (ref 135–146)

## 2016-12-01 LAB — LIPID PANEL
Cholesterol: 156 mg/dL (ref <200)
HDL: 61 mg/dL (ref >40)
LDL Cholesterol: 79 mg/dL (ref <100)
Total CHOL/HDL Ratio: 2.6 Ratio (ref <5.0)
Triglycerides: 78 mg/dL (ref <150)
VLDL: 16 mg/dL (ref <30)

## 2016-12-01 LAB — HEPATIC FUNCTION PANEL
ALT: 18 U/L (ref 9–46)
AST: 28 U/L (ref 10–35)
Albumin: 4.1 g/dL (ref 3.6–5.1)
Alkaline Phosphatase: 75 U/L (ref 40–115)
Bilirubin, Direct: 0.2 mg/dL (ref <=0.2)
Indirect Bilirubin: 0.5 mg/dL (ref 0.2–1.2)
Total Bilirubin: 0.7 mg/dL (ref 0.2–1.2)
Total Protein: 6.8 g/dL (ref 6.1–8.1)

## 2016-12-01 LAB — URINALYSIS, ROUTINE W REFLEX MICROSCOPIC
Bilirubin Urine: NEGATIVE
Glucose, UA: NEGATIVE
Leukocytes, UA: NEGATIVE
Nitrite: NEGATIVE
Protein, ur: NEGATIVE
Specific Gravity, Urine: 1.016 (ref 1.001–1.035)
pH: 5.5 (ref 5.0–8.0)

## 2016-12-01 LAB — URINALYSIS, MICROSCOPIC ONLY
Bacteria, UA: NONE SEEN [HPF]
Casts: NONE SEEN [LPF]
Crystals: NONE SEEN [HPF]
RBC / HPF: NONE SEEN RBC/HPF (ref <=2)
Squamous Epithelial / LPF: NONE SEEN [HPF] (ref <=5)
WBC, UA: NONE SEEN WBC/HPF (ref <=5)
Yeast: NONE SEEN [HPF]

## 2016-12-01 LAB — MAGNESIUM: Magnesium: 2 mg/dL (ref 1.5–2.5)

## 2016-12-01 LAB — HEMOGLOBIN A1C
Hgb A1c MFr Bld: 5.1 % (ref <5.7)
Mean Plasma Glucose: 100 mg/dL

## 2016-12-01 LAB — VITAMIN D 25 HYDROXY (VIT D DEFICIENCY, FRACTURES): Vit D, 25-Hydroxy: 82 ng/mL (ref 30–100)

## 2016-12-01 LAB — MICROALBUMIN / CREATININE URINE RATIO
Creatinine, Urine: 198 mg/dL (ref 20–370)
Microalb Creat Ratio: 3 mcg/mg creat (ref <30)
Microalb, Ur: 0.5 mg/dL

## 2016-12-01 LAB — INSULIN, RANDOM: Insulin: 2.9 u[IU]/mL (ref 2.0–19.6)

## 2016-12-10 ENCOUNTER — Other Ambulatory Visit: Payer: Self-pay

## 2016-12-10 DIAGNOSIS — Z1212 Encounter for screening for malignant neoplasm of rectum: Secondary | ICD-10-CM

## 2016-12-10 LAB — POC HEMOCCULT BLD/STL (HOME/3-CARD/SCREEN)
Card #2 Fecal Occult Blod, POC: NEGATIVE
Card #3 Fecal Occult Blood, POC: NEGATIVE
Fecal Occult Blood, POC: NEGATIVE

## 2017-02-17 DIAGNOSIS — L814 Other melanin hyperpigmentation: Secondary | ICD-10-CM | POA: Diagnosis not present

## 2017-02-17 DIAGNOSIS — L821 Other seborrheic keratosis: Secondary | ICD-10-CM | POA: Diagnosis not present

## 2017-02-17 DIAGNOSIS — L570 Actinic keratosis: Secondary | ICD-10-CM | POA: Diagnosis not present

## 2017-02-17 DIAGNOSIS — D1801 Hemangioma of skin and subcutaneous tissue: Secondary | ICD-10-CM | POA: Diagnosis not present

## 2017-03-07 ENCOUNTER — Ambulatory Visit: Payer: Self-pay | Admitting: Internal Medicine

## 2017-03-16 ENCOUNTER — Ambulatory Visit (INDEPENDENT_AMBULATORY_CARE_PROVIDER_SITE_OTHER): Payer: PPO | Admitting: Internal Medicine

## 2017-03-16 ENCOUNTER — Encounter: Payer: Self-pay | Admitting: Internal Medicine

## 2017-03-16 VITALS — BP 122/82 | HR 64 | Temp 97.0°F | Resp 16 | Ht 70.75 in | Wt 180.2 lb

## 2017-03-16 DIAGNOSIS — E782 Mixed hyperlipidemia: Secondary | ICD-10-CM

## 2017-03-16 DIAGNOSIS — I10 Essential (primary) hypertension: Secondary | ICD-10-CM

## 2017-03-16 DIAGNOSIS — R7303 Prediabetes: Secondary | ICD-10-CM

## 2017-03-16 DIAGNOSIS — E559 Vitamin D deficiency, unspecified: Secondary | ICD-10-CM | POA: Diagnosis not present

## 2017-03-16 DIAGNOSIS — Z79899 Other long term (current) drug therapy: Secondary | ICD-10-CM

## 2017-03-16 LAB — CBC WITH DIFFERENTIAL/PLATELET
Basophils Absolute: 54 cells/uL (ref 0–200)
Basophils Relative: 1 %
Eosinophils Absolute: 162 cells/uL (ref 15–500)
Eosinophils Relative: 3 %
HCT: 41.4 % (ref 38.5–50.0)
Hemoglobin: 13.9 g/dL (ref 13.2–17.1)
Lymphocytes Relative: 31 %
Lymphs Abs: 1674 cells/uL (ref 850–3900)
MCH: 29.1 pg (ref 27.0–33.0)
MCHC: 33.6 g/dL (ref 32.0–36.0)
MCV: 86.6 fL (ref 80.0–100.0)
MPV: 9.3 fL (ref 7.5–12.5)
Monocytes Absolute: 594 cells/uL (ref 200–950)
Monocytes Relative: 11 %
Neutro Abs: 2916 cells/uL (ref 1500–7800)
Neutrophils Relative %: 54 %
Platelets: 208 10*3/uL (ref 140–400)
RBC: 4.78 MIL/uL (ref 4.20–5.80)
RDW: 15.2 % — ABNORMAL HIGH (ref 11.0–15.0)
WBC: 5.4 10*3/uL (ref 3.8–10.8)

## 2017-03-16 LAB — BASIC METABOLIC PANEL WITH GFR
BUN: 23 mg/dL (ref 7–25)
CO2: 26 mmol/L (ref 20–31)
Calcium: 8.9 mg/dL (ref 8.6–10.3)
Chloride: 103 mmol/L (ref 98–110)
Creat: 1.15 mg/dL (ref 0.70–1.18)
GFR, Est African American: 73 mL/min (ref >=60)
GFR, Est Non African American: 63 mL/min (ref >=60)
Glucose, Bld: 90 mg/dL (ref 65–99)
Potassium: 3.9 mmol/L (ref 3.5–5.3)
Sodium: 138 mmol/L (ref 135–146)

## 2017-03-16 LAB — LIPID PANEL
Cholesterol: 154 mg/dL (ref <200)
HDL: 57 mg/dL (ref >40)
LDL Cholesterol: 76 mg/dL (ref <100)
Total CHOL/HDL Ratio: 2.7 Ratio (ref <5.0)
Triglycerides: 105 mg/dL (ref <150)
VLDL: 21 mg/dL (ref <30)

## 2017-03-16 LAB — HEPATIC FUNCTION PANEL
ALT: 15 U/L (ref 9–46)
AST: 24 U/L (ref 10–35)
Albumin: 4 g/dL (ref 3.6–5.1)
Alkaline Phosphatase: 71 U/L (ref 40–115)
Bilirubin, Direct: 0.1 mg/dL (ref <=0.2)
Indirect Bilirubin: 0.4 mg/dL (ref 0.2–1.2)
Total Bilirubin: 0.5 mg/dL (ref 0.2–1.2)
Total Protein: 6.4 g/dL (ref 6.1–8.1)

## 2017-03-16 LAB — TSH: TSH: 2.27 mIU/L (ref 0.40–4.50)

## 2017-03-16 NOTE — Patient Instructions (Signed)

## 2017-03-16 NOTE — Progress Notes (Signed)
This very nice 71 y.o. MWM presents for 3 month follow up with Hypertension, Hyperlipidemia, Pre-Diabetes and Vitamin D Deficiency.      Patient is treated for HTN & BP has been controlled at home. Today's BP is at goal - 122/82. Patient has had no complaints of any cardiac type chest pain, palpitations, dyspnea/orthopnea/PND, dizziness, claudication, or dependent edema.     Hyperlipidemia is controlled with diet & meds. Patient denies myalgias or other med SE's. Last Lipids were at goal: Lab Results  Component Value Date   CHOL 156 11/30/2016   HDL 61 11/30/2016   LDLCALC 79 11/30/2016   TRIG 78 11/30/2016   CHOLHDL 2.6 11/30/2016      Also, the patient has history of PreDiabetes (A1c 5.9% in 2010)  and has had no symptoms of reactive hypoglycemia, diabetic polys, paresthesias or visual blurring.  Last A1c was at goal: Lab Results  Component Value Date   HGBA1C 5.1 11/30/2016      Further, the patient also has history of Vitamin D Deficiency ("41" on treatment in 2008) and supplements vitamin D without any suspected side-effects. Last vitamin D was at goal: Lab Results  Component Value Date   VD25OH 82 11/30/2016   Current Outpatient Prescriptions on File Prior to Visit  Medication Sig  . aspirin 81 MG tablet Take 81 mg by mouth daily.  . Atenolol 25 MG tablet Take 1 tablet (25 mg total) by mouth daily.  Marland Kitchen VITAMIN D  Take 2,000 Units by mouth 2 (two) times daily.   . hctz 25 MG tablet Take 1 tablet (25 mg total) by mouth daily.  . Omega-3 FISH OIL Take by mouth daily.  . Simvastatin 40 MG tablet Take 1 tablet (40 mg total) by mouth every evening.   NKA  PMHx:   Past Medical History:  Diagnosis Date  . BPH (benign prostatic hyperplasia)   . GERD (gastroesophageal reflux disease)   . Hyperlipidemia   . Hypertension   . Prediabetes   . Vitamin D deficiency    Immunization History  Administered Date(s) Administered  . Influenza, High Dose Seasonal PF 08/21/2014,  09/09/2015, 07/06/2016  . Pneumococcal Polysaccharide-23 06/05/2009  . Td 01/26/2007   Past Surgical History:  Procedure Laterality Date  . CEREBRAL ANEURYSM REPAIR  1996   FHx:    Reviewed / unchanged  SHx:    Reviewed / unchanged  Systems Review:  Constitutional: Denies fever, chills, wt changes, headaches, insomnia, fatigue, night sweats, change in appetite. Eyes: Denies redness, blurred vision, diplopia, discharge, itchy, watery eyes.  ENT: Denies discharge, congestion, post nasal drip, epistaxis, sore throat, earache, hearing loss, dental pain, tinnitus, vertigo, sinus pain, snoring.  CV: Denies chest pain, palpitations, irregular heartbeat, syncope, dyspnea, diaphoresis, orthopnea, PND, claudication or edema. Respiratory: denies cough, dyspnea, DOE, pleurisy, hoarseness, laryngitis, wheezing.  Gastrointestinal: Denies dysphagia, odynophagia, heartburn, reflux, water brash, abdominal pain or cramps, nausea, vomiting, bloating, diarrhea, constipation, hematemesis, melena, hematochezia  or hemorrhoids. Genitourinary: Denies dysuria, frequency, urgency, nocturia, hesitancy, discharge, hematuria or flank pain. Musculoskeletal: Denies arthralgias, myalgias, stiffness, jt. swelling, pain, limping or strain/sprain.  Skin: Denies pruritus, rash, hives, warts, acne, eczema or change in skin lesion(s). Neuro: No weakness, tremor, incoordination, spasms, paresthesia or pain. Psychiatric: Denies confusion, memory loss or sensory loss. Endo: Denies change in weight, skin or hair change.  Heme/Lymph: No excessive bleeding, bruising or enlarged lymph nodes.  Physical Exam  BP 122/82   Pulse 64   Temp 97 F (  36.1 C)   Resp 16   Ht 5' 10.75" (1.797 m)   Wt 180 lb 3.2 oz (81.7 kg)   BMI 25.31 kg/m   Appears well nourished, well groomed  and in no distress.  Eyes: PERRLA, EOMs, conjunctiva no swelling or erythema. Sinuses: No frontal/maxillary tenderness ENT/Mouth: EAC's clear, TM's nl  w/o erythema, bulging. Nares clear w/o erythema, swelling, exudates. Oropharynx clear without erythema or exudates. Oral hygiene is good. Tongue normal, non obstructing. Hearing intact.  Neck: Supple. Thyroid nl. Car 2+/2+ without bruits, nodes or JVD. Chest: Respirations nl with BS clear & equal w/o rales, rhonchi, wheezing or stridor.  Cor: Heart sounds normal w/ regular rate and rhythm without sig. murmurs, gallops, clicks or rubs. Peripheral pulses normal and equal  without edema.  Abdomen: Soft & bowel sounds normal. Non-tender w/o guarding, rebound, hernias, masses or organomegaly.  Lymphatics: Unremarkable.  Musculoskeletal: Full ROM all peripheral extremities, joint stability, 5/5 strength and normal gait.  Skin: Warm, dry without exposed rashes, lesions or ecchymosis apparent.  Neuro: Cranial nerves intact, reflexes equal bilaterally. Sensory-motor testing grossly intact. Tendon reflexes grossly intact.  Pysch: Alert & oriented x 3.  Insight and judgement nl & appropriate. No ideations.  Assessment and Plan:  1. Essential hypertension  - Continue medication, monitor blood pressure at home.  - Continue DASH diet. Reminder to go to the ER if any CP,  SOB, nausea, dizziness, severe HA, changes vision/speech,  left arm numbness and tingling and jaw pain. - CBC with Differential/Platelet - BASIC METABOLIC PANEL WITH GFR - Magnesium - TSH  2. Mixed hyperlipidemia  - Continue diet/meds, exercise,& lifestyle modifications.  - Continue monitor periodic cholesterol/liver & renal functions   - Hepatic function panel - Lipid panel - TSH  3. Prediabetes  - Continue diet, exercise, lifestyle modifications.  - Monitor appropriate labs.  - Hemoglobin A1c - Insulin, random  4. Vitamin D deficiency  - Continue supplementation.  - VITAMIN D 25 Hydroxy   5. Medication management  - CBC with Differential/Platelet - BASIC METABOLIC PANEL WITH GFR - Hepatic function panel -  Magnesium - Lipid panel - TSH - Hemoglobin A1c - Insulin, random - VITAMIN D 25 Hydroxy        Discussed  regular exercise, BP monitoring, weight control to achieve/maintain BMI less than 25 and discussed med and SE's. Recommended labs to assess and monitor clinical status with further disposition pending results of labs. Over 30 minutes of exam, counseling, chart review was performed.

## 2017-03-17 LAB — MAGNESIUM: Magnesium: 2.1 mg/dL (ref 1.5–2.5)

## 2017-03-17 LAB — VITAMIN D 25 HYDROXY (VIT D DEFICIENCY, FRACTURES): Vit D, 25-Hydroxy: 64 ng/mL (ref 30–100)

## 2017-03-17 LAB — HEMOGLOBIN A1C
Hgb A1c MFr Bld: 5.1 % (ref <5.7)
Mean Plasma Glucose: 100 mg/dL

## 2017-03-17 LAB — INSULIN, RANDOM: Insulin: 12.9 u[IU]/mL (ref 2.0–19.6)

## 2017-05-12 ENCOUNTER — Other Ambulatory Visit: Payer: Self-pay | Admitting: Internal Medicine

## 2017-06-06 ENCOUNTER — Other Ambulatory Visit: Payer: Self-pay | Admitting: Internal Medicine

## 2017-06-07 ENCOUNTER — Ambulatory Visit: Payer: Self-pay | Admitting: Internal Medicine

## 2017-06-13 ENCOUNTER — Ambulatory Visit: Payer: Self-pay | Admitting: Internal Medicine

## 2017-07-12 NOTE — Progress Notes (Signed)
This very nice 71 y.o. MWM presents for 6 month follow up with Hypertension, Hyperlipidemia, Pre-Diabetes and Vitamin D Deficiency. Patient also relates a dry non-productive cough for several months. Has remote smoking hx of 10-15 years and quit in 1977.      Patient is treated for HTN (1996) & BP has been controlled at home. Today's BP is 112/64. Patient has had no complaints of any cardiac type chest pain, palpitations, dyspnea/orthopnea/PND, dizziness, claudication, or dependent edema.     Hyperlipidemia is controlled with diet & meds. Patient denies myalgias or other med SE's. Last Lipids were at goal:  Lab Results  Component Value Date   CHOL 172 07/13/2017   HDL 62 07/13/2017   LDLCALC 76 03/16/2017   TRIG 85 07/13/2017   CHOLHDL 2.8 07/13/2017      Also, the patient has history of PreDiabetes (A1c 5.9% / 2010) and has had no symptoms of reactive hypoglycemia, diabetic polys, paresthesias or visual blurring.  Last A1c was at goal:  Lab Results  Component Value Date   HGBA1C 5.1 03/16/2017      Further, the patient also has history of Vitamin D Deficiency (2008 of "41") and supplements vitamin D without any suspected side-effects. Last vitamin D was at goal:  Lab Results  Component Value Date   VD25OH 64 03/16/2017   Current Outpatient Prescriptions on File Prior to Visit  Medication Sig  . aspirin 81 MG tablet Take 81 mg by mouth daily.  Marland Kitchen atenolol (TENORMIN) 25 MG tablet TAKE ONE TABLET BY MOUTH DAILY  . Cholecalciferol (VITAMIN D PO) Take 2,000 Units by mouth 2 (two) times daily.   . hydrochlorothiazide (HYDRODIURIL) 25 MG tablet TAKE 1 TABLET (25 MG TOTAL) BY MOUTH DAILY  . Omega-3 Fatty Acids (FISH OIL PO) Take by mouth daily.  . simvastatin (ZOCOR) 40 MG tablet TAKE 1 TABLET (40 MG TOTAL) BY MOUTH EVERY EVENING.   No current facility-administered medications on file prior to visit.    No Known Allergies PMHx:   Past Medical History:  Diagnosis Date  . BPH  (benign prostatic hyperplasia)   . GERD (gastroesophageal reflux disease)   . Hyperlipidemia   . Hypertension   . Prediabetes   . Vitamin D deficiency    Immunization History  Administered Date(s) Administered  . Influenza, High Dose Seasonal PF 08/21/2014, 09/09/2015, 07/06/2016  . Pneumococcal Polysaccharide-23 06/05/2009  . Td 01/26/2007   Past Surgical History:  Procedure Laterality Date  . CEREBRAL ANEURYSM REPAIR  1996   FHx:    Reviewed / unchanged  SHx:    Reviewed / unchanged  Systems Review:  Constitutional: Denies fever, chills, wt changes, headaches, insomnia, fatigue, night sweats, change in appetite. Eyes: Denies redness, blurred vision, diplopia, discharge, itchy, watery eyes.  ENT: Denies discharge, congestion, post nasal drip, epistaxis, sore throat, earache, hearing loss, dental pain, tinnitus, vertigo, sinus pain, snoring.  CV: Denies chest pain, palpitations, irregular heartbeat, syncope, dyspnea, diaphoresis, orthopnea, PND, claudication or edema. Respiratory: denies cough, dyspnea, DOE, pleurisy, hoarseness, laryngitis, wheezing.  Gastrointestinal: Denies dysphagia, odynophagia, heartburn, reflux, water brash, abdominal pain or cramps, nausea, vomiting, bloating, diarrhea, constipation, hematemesis, melena, hematochezia  or hemorrhoids. Genitourinary: Denies dysuria, frequency, urgency, nocturia, hesitancy, discharge, hematuria or flank pain. Musculoskeletal: Denies arthralgias, myalgias, stiffness, jt. swelling, pain, limping or strain/sprain.  Skin: Denies pruritus, rash, hives, warts, acne, eczema or change in skin lesion(s). Neuro: No weakness, tremor, incoordination, spasms, paresthesia or pain. Psychiatric: Denies confusion, memory loss or  sensory loss. Endo: Denies change in weight, skin or hair change.  Heme/Lymph: No excessive bleeding, bruising or enlarged lymph nodes.  Physical Exam  BP 112/64   Pulse 64   Temp (!) 97.3 F (36.3 C)   Resp 16    Ht 5' 10.75" (1.797 m)   Wt 185 lb 12.8 oz (84.3 kg)   BMI 26.10 kg/m   Appears well nourished, well groomed  and in no distress. Has paroxysms of hacking cough and slight hoarseness.   Eyes: PERRLA, EOMs, conjunctiva no swelling or erythema. Sinuses: No frontal/maxillary tenderness ENT/Mouth: EAC's clear, TM's nl w/o erythema, bulging. Nares clear w/o erythema, swelling, exudates. Oropharynx clear without erythema or exudates. Oral hygiene is good. Tongue normal, non obstructing. Hearing intact.  Neck: Supple. Thyroid nl. Car 2+/2+ without bruits, nodes or JVD. Chest: Respirations nl with BS clear & equal w/o rales, rhonchi, wheezing or stridor.  Cor: Heart sounds normal w/ regular rate and rhythm without sig. murmurs, gallops, clicks or rubs. Peripheral pulses normal and equal  without edema.  Abdomen: Soft & bowel sounds normal. Non-tender w/o guarding, rebound, hernias, masses or organomegaly.  Lymphatics: Unremarkable.  Musculoskeletal: Full ROM all peripheral extremities, joint stability, 5/5 strength and normal gait.  Skin: Warm, dry without exposed rashes, lesions or ecchymosis apparent.  Neuro: Cranial nerves intact, reflexes equal bilaterally. Sensory-motor testing grossly intact. Tendon reflexes grossly intact.  Pysch: Alert & oriented x 3.  Insight and judgement nl & appropriate. No ideations.  Assessment and Plan:  1. Essential hypertension  - Continue medication, monitor blood pressure at home.  - Continue DASH diet. Reminder to go to the ER if any CP,  SOB, nausea, dizziness, severe HA, changes vision/speech.  - CBC with Differential/Platelet - BASIC METABOLIC PANEL WITH GFR - Magnesium - TSH  2. Mixed hyperlipidemia  - Continue diet/meds, exercise,& lifestyle modifications.  - Continue monitor periodic cholesterol/liver & renal functions   - CBC with Differential/Platelet - Hepatic function panel - Lipid panel - TSH  3. Prediabetes  Continue diet,  exercise, lifestyle modifications.  - Monitor appropriate labs.  - Hemoglobin A1c - Insulin, fasting  4. Vitamin D deficiency   - Continue supplementation.  - VITAMIN D 25 Hydroxy  5. Medication management  - CBC with Differential/Platelet - BASIC METABOLIC PANEL WITH GFR - Hepatic function panel - Magnesium - Lipid panel - TSH - Hemoglobin A1c - Insulin, fasting - VITAMIN D 25 Hydroxy  6. Cough   - Suspect may be related to reflux. Will check CXR and treat empirically with Omeprazole to see of cough & sl hoarseness resolve.        Discussed  regular exercise, BP monitoring, weight control to achieve/maintain BMI less than 25 and discussed med and SE's. Recommended labs to assess and monitor clinical status with further disposition pending results of labs. Over 30 minutes of exam, counseling, chart review was performed.

## 2017-07-12 NOTE — Patient Instructions (Signed)

## 2017-07-13 ENCOUNTER — Encounter: Payer: Self-pay | Admitting: Internal Medicine

## 2017-07-13 ENCOUNTER — Ambulatory Visit (INDEPENDENT_AMBULATORY_CARE_PROVIDER_SITE_OTHER): Payer: PPO | Admitting: Internal Medicine

## 2017-07-13 ENCOUNTER — Ambulatory Visit (HOSPITAL_COMMUNITY)
Admission: RE | Admit: 2017-07-13 | Discharge: 2017-07-13 | Disposition: A | Discharge status: Home / Self Care | Payer: PPO | Source: Ambulatory Visit | Attending: Internal Medicine | Admitting: Internal Medicine

## 2017-07-13 VITALS — BP 112/64 | HR 64 | Temp 97.3°F | Resp 16 | Ht 70.75 in | Wt 185.8 lb

## 2017-07-13 DIAGNOSIS — K219 Gastro-esophageal reflux disease without esophagitis: Secondary | ICD-10-CM | POA: Diagnosis not present

## 2017-07-13 DIAGNOSIS — Z79899 Other long term (current) drug therapy: Secondary | ICD-10-CM | POA: Diagnosis not present

## 2017-07-13 DIAGNOSIS — E782 Mixed hyperlipidemia: Secondary | ICD-10-CM | POA: Diagnosis not present

## 2017-07-13 DIAGNOSIS — R059 Cough, unspecified: Secondary | ICD-10-CM

## 2017-07-13 DIAGNOSIS — E559 Vitamin D deficiency, unspecified: Secondary | ICD-10-CM

## 2017-07-13 DIAGNOSIS — R7303 Prediabetes: Secondary | ICD-10-CM | POA: Diagnosis not present

## 2017-07-13 DIAGNOSIS — I10 Essential (primary) hypertension: Secondary | ICD-10-CM

## 2017-07-13 DIAGNOSIS — R05 Cough: Secondary | ICD-10-CM

## 2017-07-13 MED ORDER — OMEPRAZOLE 40 MG PO CPDR: DELAYED_RELEASE_CAPSULE | ORAL | 3 refills | Status: DC

## 2017-07-14 LAB — LIPID PANEL
Cholesterol: 172 mg/dL (ref <200)
HDL: 62 mg/dL (ref >40)
LDL Cholesterol (Calc): 92 mg/dL (calc)
Non-HDL Cholesterol (Calc): 110 mg/dL (calc) (ref <130)
Total CHOL/HDL Ratio: 2.8 (calc) (ref <5.0)
Triglycerides: 85 mg/dL (ref <150)

## 2017-07-14 LAB — HEPATIC FUNCTION PANEL
AG Ratio: 1.8 (calc) (ref 1.0–2.5)
ALT: 17 U/L (ref 9–46)
AST: 24 U/L (ref 10–35)
Albumin: 4.2 g/dL (ref 3.6–5.1)
Alkaline phosphatase (APISO): 77 U/L (ref 40–115)
Bilirubin, Direct: 0.2 mg/dL (ref 0.0–0.2)
Globulin: 2.4 g/dL (calc) (ref 1.9–3.7)
Indirect Bilirubin: 0.6 mg/dL (calc) (ref 0.2–1.2)
Total Bilirubin: 0.8 mg/dL (ref 0.2–1.2)
Total Protein: 6.6 g/dL (ref 6.1–8.1)

## 2017-07-14 LAB — TSH: TSH: 2.38 mIU/L (ref 0.40–4.50)

## 2017-07-14 LAB — CBC WITH DIFFERENTIAL/PLATELET
Basophils Absolute: 81 cells/uL (ref 0–200)
Basophils Relative: 1.5 %
Eosinophils Absolute: 140 cells/uL (ref 15–500)
Eosinophils Relative: 2.6 %
HCT: 42.9 % (ref 38.5–50.0)
Hemoglobin: 14.5 g/dL (ref 13.2–17.1)
Lymphs Abs: 1350 cells/uL (ref 850–3900)
MCH: 29.7 pg (ref 27.0–33.0)
MCHC: 33.8 g/dL (ref 32.0–36.0)
MCV: 87.9 fL (ref 80.0–100.0)
MPV: 9.7 fL (ref 7.5–12.5)
Monocytes Relative: 9.3 %
Neutro Abs: 3326 cells/uL (ref 1500–7800)
Neutrophils Relative %: 61.6 %
Platelets: 224 10*3/uL (ref 140–400)
RBC: 4.88 10*6/uL (ref 4.20–5.80)
RDW: 13.1 % (ref 11.0–15.0)
Total Lymphocyte: 25 %
WBC mixed population: 502 cells/uL (ref 200–950)
WBC: 5.4 10*3/uL (ref 3.8–10.8)

## 2017-07-14 LAB — BASIC METABOLIC PANEL WITH GFR
BUN: 21 mg/dL (ref 7–25)
CO2: 30 mmol/L (ref 20–32)
Calcium: 9.5 mg/dL (ref 8.6–10.3)
Chloride: 105 mmol/L (ref 98–110)
Creat: 1.06 mg/dL (ref 0.70–1.18)
GFR, Est African American: 80 mL/min/{1.73_m2} (ref >=60)
GFR, Est Non African American: 69 mL/min/{1.73_m2} (ref >=60)
Glucose, Bld: 91 mg/dL (ref 65–99)
Potassium: 4 mmol/L (ref 3.5–5.3)
Sodium: 142 mmol/L (ref 135–146)

## 2017-07-14 LAB — HEMOGLOBIN A1C
Hgb A1c MFr Bld: 5.3 % of total Hgb (ref <5.7)
Mean Plasma Glucose: 105 (calc)
eAG (mmol/L): 5.8 (calc)

## 2017-07-14 LAB — MAGNESIUM: Magnesium: 2 mg/dL (ref 1.5–2.5)

## 2017-07-14 LAB — INSULIN, FASTING: Insulin: 28.6 u[IU]/mL — ABNORMAL HIGH (ref 2.0–19.6)

## 2017-07-14 LAB — VITAMIN D 25 HYDROXY (VIT D DEFICIENCY, FRACTURES): Vit D, 25-Hydroxy: 72 ng/mL (ref 30–100)

## 2017-08-01 ENCOUNTER — Ambulatory Visit: Payer: Self-pay | Admitting: Internal Medicine

## 2017-08-02 ENCOUNTER — Ambulatory Visit: Payer: Self-pay | Admitting: Internal Medicine

## 2017-09-08 ENCOUNTER — Ambulatory Visit: Payer: PPO

## 2017-10-03 ENCOUNTER — Ambulatory Visit: Payer: PPO | Admitting: Internal Medicine

## 2017-10-03 ENCOUNTER — Ambulatory Visit: Payer: Self-pay | Admitting: Internal Medicine

## 2017-10-03 VITALS — BP 118/78 | HR 68 | Temp 97.5°F | Resp 16 | Ht 70.75 in | Wt 195.8 lb

## 2017-10-03 DIAGNOSIS — B079 Viral wart, unspecified: Secondary | ICD-10-CM | POA: Diagnosis not present

## 2017-10-03 DIAGNOSIS — I10 Essential (primary) hypertension: Secondary | ICD-10-CM

## 2017-10-03 DIAGNOSIS — L820 Inflamed seborrheic keratosis: Secondary | ICD-10-CM | POA: Diagnosis not present

## 2017-10-03 DIAGNOSIS — L570 Actinic keratosis: Secondary | ICD-10-CM | POA: Diagnosis not present

## 2017-10-04 NOTE — Progress Notes (Signed)
[facility]   Unk Pinto, M.D.    Uvaldo Bristle. Silverio Lay, P.A.-C      Liane Comber, [place]                [address] [place], [place] [address] Telephone [phone] Telefax [phone]  Subjective:    Patient ID: Brendan Monroe, male    DOB: [birth year], 71 y.o.   MRN: [id number]  HPI     This very nice 71 yo MWM with HTN presents for recheck with hypertensive systems review negative for HA, dizziness, CP, palpitations, dyspnea & edema is negative.     He also has concerns re: several skin lesions of the mid forehead, Left temple, R posterior occipital scalp and R anterior shoulder area.   Outpatient Medications Prior to Visit  Medication Sig Dispense Refill  . aspirin 81 MG tablet Take 81 mg by mouth daily.    Marland Kitchen atenolol (TENORMIN) 25 MG tablet TAKE ONE TABLET BY MOUTH DAILY 90 tablet 1  . Cholecalciferol (VITAMIN D PO) Take 2,000 Units by mouth 2 (two) times daily.     . hydrochlorothiazide (HYDRODIURIL) 25 MG tablet TAKE 1 TABLET (25 MG TOTAL) BY MOUTH DAILY 90 tablet 1  . Omega-3 Fatty Acids (FISH OIL PO) Take by mouth daily.    Marland Kitchen omeprazole (PRILOSEC) 40 MG capsule Take 1 capsule every morning for Acid Reflux 90 capsule 3  . simvastatin (ZOCOR) 40 MG tablet TAKE 1 TABLET (40 MG TOTAL) BY MOUTH EVERY EVENING. 90 tablet 1   No facility-administered medications prior to visit.    No Known Allergies Past Medical History:  Diagnosis Date  . BPH (benign prostatic hyperplasia)   . GERD (gastroesophageal reflux disease)   . Hyperlipidemia   . Hypertension   . Prediabetes   . Vitamin D deficiency    Review of Systems  10 point systems review negative except as above.     Objective:   Physical Exam  BP 118/78   Pulse 68   Temp (!) 97.5 F (36.4 C)   Resp 16   Ht 5' 10.75" (1.797 m)   Wt 195 lb 12.8 oz (88.8 kg)   BMI 27.50 kg/m   HEENT - WNL. Neck - supple.  Chest -  Clear equal BS. Cor - Nl HS. RRR w/o sig MGR. PP 1(+). No edema. MS- FROM w/o deformities.  Gait Nl. Neuro -  Nl w/o focal abnormalities. Skin - #4 lesions of concern include   (1)  Apparent 4-5 mm filamentous verruca in the mid forehead (2)  3 x 4 mm irregular sl raised brown inflamed seborrheic keratosis of the Left temple (3)  ~ 10 x 12 mm ulcerative lesion of the R occipital scalp suspect for Actinic Keratosis vs SCC (4)  ~ 10 x 12 mm ulcerative lesion of the R anterior shoulder suspect for Actinic Keratosis vs SCC  - Procedure(s)   Lesions 1 & 2 treated with Liquid Nitrogen by triple freeze/thaw technique Lesions 3 & 4 treated deeply with Liquid Nitrogen by quadruple freeze/thaw with a 3 mm margin   - Patient instructed in post procedure wound care & monitoring for healing or return if lesions not completely resolve.    Assessment & Plan:   1. Essential hypertension  2. Wart of face  3. Seborrheic keratoses, inflamed  4. Actinic keratosis vs SCC, occipital  scalp & R shoulder

## 2017-10-11 ENCOUNTER — Encounter: Payer: Self-pay | Admitting: Internal Medicine

## 2017-11-16 DIAGNOSIS — H04123 Dry eye syndrome of bilateral lacrimal glands: Secondary | ICD-10-CM | POA: Diagnosis not present

## 2017-11-16 DIAGNOSIS — H02834 Dermatochalasis of left upper eyelid: Secondary | ICD-10-CM | POA: Diagnosis not present

## 2017-11-16 DIAGNOSIS — H25813 Combined forms of age-related cataract, bilateral: Secondary | ICD-10-CM | POA: Diagnosis not present

## 2017-11-16 DIAGNOSIS — H02831 Dermatochalasis of right upper eyelid: Secondary | ICD-10-CM | POA: Diagnosis not present

## 2017-12-29 ENCOUNTER — Encounter: Payer: Self-pay | Admitting: Internal Medicine

## 2017-12-29 ENCOUNTER — Ambulatory Visit (INDEPENDENT_AMBULATORY_CARE_PROVIDER_SITE_OTHER): Payer: PPO | Admitting: Internal Medicine

## 2017-12-29 VITALS — BP 122/82 | HR 68 | Temp 97.9°F | Resp 16 | Ht 70.0 in | Wt 195.0 lb

## 2017-12-29 DIAGNOSIS — Z0001 Encounter for general adult medical examination with abnormal findings: Secondary | ICD-10-CM | POA: Diagnosis not present

## 2017-12-29 DIAGNOSIS — N401 Enlarged prostate with lower urinary tract symptoms: Secondary | ICD-10-CM

## 2017-12-29 DIAGNOSIS — I10 Essential (primary) hypertension: Secondary | ICD-10-CM | POA: Diagnosis not present

## 2017-12-29 DIAGNOSIS — E782 Mixed hyperlipidemia: Secondary | ICD-10-CM | POA: Diagnosis not present

## 2017-12-29 DIAGNOSIS — Z136 Encounter for screening for cardiovascular disorders: Secondary | ICD-10-CM

## 2017-12-29 DIAGNOSIS — K219 Gastro-esophageal reflux disease without esophagitis: Secondary | ICD-10-CM

## 2017-12-29 DIAGNOSIS — Z79899 Other long term (current) drug therapy: Secondary | ICD-10-CM | POA: Diagnosis not present

## 2017-12-29 DIAGNOSIS — R6889 Other general symptoms and signs: Secondary | ICD-10-CM | POA: Diagnosis not present

## 2017-12-29 DIAGNOSIS — D485 Neoplasm of uncertain behavior of skin: Secondary | ICD-10-CM | POA: Diagnosis not present

## 2017-12-29 DIAGNOSIS — Z87891 Personal history of nicotine dependence: Secondary | ICD-10-CM

## 2017-12-29 DIAGNOSIS — Z1212 Encounter for screening for malignant neoplasm of rectum: Secondary | ICD-10-CM

## 2017-12-29 DIAGNOSIS — R7303 Prediabetes: Secondary | ICD-10-CM

## 2017-12-29 DIAGNOSIS — E559 Vitamin D deficiency, unspecified: Secondary | ICD-10-CM

## 2017-12-29 DIAGNOSIS — R7309 Other abnormal glucose: Secondary | ICD-10-CM

## 2017-12-29 DIAGNOSIS — Z1211 Encounter for screening for malignant neoplasm of colon: Secondary | ICD-10-CM

## 2017-12-29 NOTE — Progress Notes (Signed)
[facility]   Unk Pinto, M.D.     Uvaldo Bristle. Silverio Lay, P.A.-C Liane Comber, [place]                [address] [place] [address] Telephone [phone] Telefax [phone] Annual  Screening/Preventative Visit  & Comprehensive Evaluation & Examination     This very nice 72 y.o.  MWM  presents for a Screening/Preventative Visit & comprehensive evaluation and management of multiple medical co-morbidities.  Patient has been followed for HTN, Prediabetes, HLD and Vitamin D Deficiency.     Patient also has concerns about a pin rounded skin lesion of his loer midline chest that keeps increasing in size.      HTN predates circa 1996. Patient's BP has been controlled at home.  Today's BP is at goal - 122/82. Patient denies any cardiac symptoms as chest pain, palpitations, shortness of breath, dizziness or ankle swelling.     Patient's hyperlipidemia is controlled with diet and medications. Patient denies myalgias or other medication SE's. Last lipids were at goal: Lab Results  Component Value Date   CHOL 172 07/13/2017   HDL 62 07/13/2017   LDLCALC 76 03/16/2017   TRIG 85 07/13/2017   CHOLHDL 2.8 07/13/2017      Patient has prediabetes (A1c 5.9%/2010)  and patient denies reactive hypoglycemic symptoms, visual blurring, diabetic polys or paresthesias. Last A1c was Normal & at goal: Lab Results  Component Value Date   HGBA1C 5.3 07/13/2017       Finally, patient has history of Vitamin D Deficiency ("41"/2008) and last vitamin D was Normal & at goal: Lab Results  Component Value Date   VD25OH 72 07/13/2017   Current Outpatient Medications on File Prior to Visit  Medication Sig  . aspirin 81 MG tablet Take  daily.  Marland Kitchen atenolol  25 MG tablet TAKE ONE TABDAILY  . VITAMIN D  2,000 Units Take   2 ( times daily.   . hctz 25 MG tablet TAKE 1 TAB DAILY  . Omega-3 FISH OIL Take  daily.  Marland Kitchen  omeprazole 40 MG capsule Take 1 capsule every morning for Acid Reflux  . Simvastatin 40 MG tablet TAKE 1 TAB EVERY EVENING. - takes 1/2 tab qod   No Known Allergies   Past Medical History:  Diagnosis Date  . BPH (benign prostatic hyperplasia)   . GERD (gastroesophageal reflux disease)   . Hyperlipidemia   . Hypertension   . Prediabetes   . Vitamin D deficiency    Health Maintenance  Topic Date Due  . TETANUS/TDAP  01/25/2017  . INFLUENZA VACCINE  06/08/2017  . Hepatitis C Screening  11/08/2018 (Originally 03-18-1946)  . PNA vac Low Risk Adult (2 of 2 - PCV13) 11/08/2018 (Originally 06/05/2010)  . COLONOSCOPY  07/24/2023   Immunization History  Administered Date(s) Administered  . Influenza, High Dose Seasonal PF 08/21/2014, 09/09/2015, 07/06/2016  . Pneumococcal Polysaccharide-23 06/05/2009  . Td 01/26/2007   Last Colon -  07/23/2013 Dr George Hugh recc 10 yr f/u - due 07/2023  Past Surgical History:  Procedure Laterality Date  . CEREBRAL ANEURYSM REPAIR  1996   Family History  Problem Relation Age of Onset  . Alzheimer's disease Mother   . Cancer Father        colon  . Diabetes Sister    Social History   Socioeconomic History  .  Marital status: Married    Spouse name: Not on file  . Number of children: 1 son & 1 daughter and 7 GrC  Occupational History  . 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 Smoking status: Former Smoker    Last attempt to quit: 04/30/1974    Years since quitting: 43.6  . Smokeless tobacco: Never Used  Substance and Sexual Activity  . Alcohol use: No  . Drug use: No  . Sexual activity: Not on file  Social History Narrative    ROS Constitutional: Denies fever, chills, weight loss/gain, headaches, insomnia,  night sweats or change in appetite. Does c/o fatigue. Eyes: Denies redness, blurred vision, diplopia, discharge, itchy or watery eyes.  ENT: Denies discharge, congestion, post nasal drip, epistaxis,  sore throat, earache, hearing loss, dental pain, Tinnitus, Vertigo, Sinus pain or snoring.  Cardio: Denies chest pain, palpitations, irregular heartbeat, syncope, dyspnea, diaphoresis, orthopnea, PND, claudication or edema Respiratory: denies cough, dyspnea, DOE, pleurisy, hoarseness, laryngitis or wheezing.  Gastrointestinal: Denies dysphagia, heartburn, reflux, water brash, pain, cramps, nausea, vomiting, bloating, diarrhea, constipation, hematemesis, melena, hematochezia, jaundice or hemorrhoids Genitourinary: Denies dysuria,  discharge, hematuria or flank pain. Does admit occas frequency, urgency, nocturia x 2-3 & mild hesitancy. Musculoskeletal: Denies arthralgia, myalgia, stiffness, Jt. Swelling, pain, limp or strain/sprain. Denies Falls. Skin: Denies puritis, rash, hives, warts, acne, eczema or change in skin lesion Neuro: No weakness, tremor, incoordination, spasms, paresthesia or pain Psychiatric: Denies confusion, memory loss or sensory loss. Denies Depression. Endocrine: Denies change in weight, skin, hair change, nocturia, and paresthesia, diabetic polys, visual blurring or hyper / hypo glycemic episodes.  Heme/Lymph: No excessive bleeding, bruising or enlarged lymph nodes.  Physical Exam  BP 122/82   Pulse 68   Temp 97.9 F (36.6 C)   Resp 16   Ht 5\' 10"  (1.778 m)   Wt 195 lb (88.5 kg)   BMI 27.98 kg/m   General Appearance: Well nourished and well groomed and in no apparent distress.  Eyes: PERRLA, EOMs, conjunctiva no swelling or erythema, normal fundi and vessels. Sinuses: No frontal/maxillary tenderness ENT/Mouth: EACs patent / TMs  nl. Nares clear without erythema, swelling, mucoid exudates. Oral hygiene is good. No erythema, swelling, or exudate. Tongue normal, non-obstructing. Tonsils not swollen or erythematous. Hearing normal.  Neck: Supple, thyroid not palpable. No bruits, nodes or JVD. Respiratory: Respiratory effort normal.  BS equal and clear bilateral without  rales, rhonci, wheezing or stridor. Cardio: Heart sounds are normal with regular rate and rhythm and no murmurs, rubs or gallops. Peripheral pulses are normal and equal bilaterally without edema. No aortic or femoral bruits. Chest: symmetric with normal excursions and percussion.  Abdomen: Soft, with Nl bowel sounds. Nontender, no guarding, rebound, hernias, masses, or organomegaly.  Lymphatics: Non tender without lymphadenopathy.  Genitourinary: No hernias.Testes nl. DRE - prostate nl for age - smooth & firm w/o nodules. Musculoskeletal: Full ROM all peripheral extremities, joint stability, 5/5 strength, and normal gait. Skin: Warm and dry without rashes, lesions, cyanosis, clubbing or  ecchymosis. There is a smooth pink firm raised &  rounded lesion approx 5-6 mm diameter over the lower xiphisternal junction area.  Neuro: Cranial nerves intact, reflexes equal bilaterally. Normal muscle tone, no cerebellar symptoms. Sensation intact.  Pysch: Alert and oriented X 3 with normal affect, insight and judgment appropriate.   Procedure (CPT:  01093)        After informed consent  & aseptic prep, with alcohol the  area in question above was sharply excised by shave excisional technique with a # 10 scalpel. Then the wound base  was deeply hyfrecated for for hemostasis & fulguration of any residual skin lesion with a 2-3 mm margin. Antibiotic sterile dressing was applied & patient was instructed in wound care.   Assessment and Plan  1. Annual Preventative/Screening Exam    2. Essential hypertension  - EKG 12-Lead - [place], RETROPERITNL ABD,  LTD - Urinalysis, Routine w reflex microscopic - Microalbumin / creatinine urine ratio - CBC with Differential/Platelet - BASIC METABOLIC PANEL WITH GFR - Magnesium - TSH  3. Hyperlipidemia, mixed  - EKG 12-Lead - [place], RETROPERITNL ABD,  LTD - Hepatic function panel - Lipid panel - TSH  4. Abnormal glucose  - EKG 12-Lead - [place], RETROPERITNL ABD,  LTD -  Hemoglobin A1c - Insulin, random  5. Vitamin D deficiency  - VITAMIN D 25 Hydroxy   6. Prediabetes  - EKG 12-Lead - [place], RETROPERITNL ABD,  LTD - Hemoglobin A1c - Insulin, random  7. Gastroesophageal reflux disease  - CBC with Differential/Platelet  8. Screening for colorectal cancer  - POC Hemoccult Bld/Stl  9. Benign localized prostatic hyperplasia with lower urinary tract symptoms (LUTS)  - PSA  10. Screening for ischemic heart disease  - EKG 12-Lead  11. Screening for AAA (aortic abdominal aneurysm)  - [place], RETROPERITNL ABD,  LTD  12. Former smoker  - EKG 12-Lead - [place], RETROPERITNL ABD,  LTD  13. Neoplasm of uncertain behavior of skin  - Excised (Procedure: CPT - 59458)  14. Medication management  - Urinalysis, Routine w reflex microscopic - Microalbumin / creatinine urine ratio - CBC with Differential/Platelet - BASIC METABOLIC PANEL WITH GFR - Hepatic function panel - Magnesium - Lipid panel - TSH - Hemoglobin A1c - Insulin, random - VITAMIN D 25 Hydroxy        Patient was counseled in prudent diet, weight control to achieve/maintain BMI less than 25, BP monitoring, regular exercise and medications as discussed.  Discussed med effects and SE's. Routine screening labs and tests as requested with regular follow-up as recommended. Over 40 minutes of exam, counseling, chart review and high complex critical decision making was performed

## 2017-12-29 NOTE — Patient Instructions (Signed)

## 2017-12-30 LAB — CBC WITH DIFFERENTIAL/PLATELET
Basophils Absolute: 63 cells/uL (ref 0–200)
Basophils Relative: 1.1 %
Eosinophils Absolute: 211 cells/uL (ref 15–500)
Eosinophils Relative: 3.7 %
HCT: 46.2 % (ref 38.5–50.0)
Hemoglobin: 15.7 g/dL (ref 13.2–17.1)
Lymphs Abs: 1647 cells/uL (ref 850–3900)
MCH: 29.8 pg (ref 27.0–33.0)
MCHC: 34 g/dL (ref 32.0–36.0)
MCV: 87.8 fL (ref 80.0–100.0)
MPV: 9.8 fL (ref 7.5–12.5)
Monocytes Relative: 10.6 %
Neutro Abs: 3175 cells/uL (ref 1500–7800)
Neutrophils Relative %: 55.7 %
Platelets: 236 10*3/uL (ref 140–400)
RBC: 5.26 10*6/uL (ref 4.20–5.80)
RDW: 13 % (ref 11.0–15.0)
Total Lymphocyte: 28.9 %
WBC mixed population: 604 cells/uL (ref 200–950)
WBC: 5.7 10*3/uL (ref 3.8–10.8)

## 2017-12-30 LAB — URINALYSIS, ROUTINE W REFLEX MICROSCOPIC
Bacteria, UA: NONE SEEN /HPF
Bilirubin Urine: NEGATIVE
Glucose, UA: NEGATIVE
Hyaline Cast: NONE SEEN /LPF
Ketones, ur: NEGATIVE
Leukocytes, UA: NEGATIVE
Nitrite: NEGATIVE
Protein, ur: NEGATIVE
RBC / HPF: NONE SEEN /HPF (ref 0–2)
Specific Gravity, Urine: 1.015 (ref 1.001–1.03)
Squamous Epithelial / LPF: NONE SEEN /HPF (ref <=5)
WBC, UA: NONE SEEN /HPF (ref 0–5)
pH: <=5 (ref 5.0–8.0)

## 2017-12-30 LAB — BASIC METABOLIC PANEL WITH GFR
BUN: 20 mg/dL (ref 7–25)
CO2: 29 mmol/L (ref 20–32)
Calcium: 9.3 mg/dL (ref 8.6–10.3)
Chloride: 101 mmol/L (ref 98–110)
Creat: 1.17 mg/dL (ref 0.70–1.18)
GFR, Est African American: 71 mL/min/{1.73_m2} (ref >=60)
GFR, Est Non African American: 61 mL/min/{1.73_m2} (ref >=60)
Glucose, Bld: 84 mg/dL (ref 65–99)
Potassium: 3.9 mmol/L (ref 3.5–5.3)
Sodium: 139 mmol/L (ref 135–146)

## 2017-12-30 LAB — HEPATIC FUNCTION PANEL
AG Ratio: 1.8 (calc) (ref 1.0–2.5)
ALT: 21 U/L (ref 9–46)
AST: 28 U/L (ref 10–35)
Albumin: 4.4 g/dL (ref 3.6–5.1)
Alkaline phosphatase (APISO): 91 U/L (ref 40–115)
Bilirubin, Direct: 0.2 mg/dL (ref 0.0–0.2)
Globulin: 2.4 g/dL (calc) (ref 1.9–3.7)
Indirect Bilirubin: 0.6 mg/dL (calc) (ref 0.2–1.2)
Total Bilirubin: 0.8 mg/dL (ref 0.2–1.2)
Total Protein: 6.8 g/dL (ref 6.1–8.1)

## 2017-12-30 LAB — MICROALBUMIN / CREATININE URINE RATIO
Creatinine, Urine: 94 mg/dL (ref 20–320)
Microalb Creat Ratio: 6 mcg/mg creat (ref <30)
Microalb, Ur: 0.6 mg/dL

## 2017-12-30 LAB — TSH: TSH: 3.64 mIU/L (ref 0.40–4.50)

## 2017-12-30 LAB — HEMOGLOBIN A1C
Hgb A1c MFr Bld: 5.2 % of total Hgb (ref <5.7)
Mean Plasma Glucose: 103 (calc)
eAG (mmol/L): 5.7 (calc)

## 2017-12-30 LAB — LIPID PANEL
Cholesterol: 173 mg/dL (ref <200)
HDL: 60 mg/dL (ref >40)
LDL Cholesterol (Calc): 93 mg/dL (calc)
Non-HDL Cholesterol (Calc): 113 mg/dL (calc) (ref <130)
Total CHOL/HDL Ratio: 2.9 (calc) (ref <5.0)
Triglycerides: 106 mg/dL (ref <150)

## 2017-12-30 LAB — INSULIN, RANDOM: Insulin: 4.5 u[IU]/mL (ref 2.0–19.6)

## 2017-12-30 LAB — PSA: PSA: 1.1 ng/mL (ref <=4.0)

## 2017-12-30 LAB — VITAMIN D 25 HYDROXY (VIT D DEFICIENCY, FRACTURES): Vit D, 25-Hydroxy: 80 ng/mL (ref 30–100)

## 2017-12-30 LAB — MAGNESIUM: Magnesium: 2.1 mg/dL (ref 1.5–2.5)

## 2018-01-24 ENCOUNTER — Other Ambulatory Visit: Payer: Self-pay

## 2018-01-24 DIAGNOSIS — Z1212 Encounter for screening for malignant neoplasm of rectum: Principal | ICD-10-CM

## 2018-01-24 DIAGNOSIS — Z1211 Encounter for screening for malignant neoplasm of colon: Secondary | ICD-10-CM

## 2018-01-24 LAB — POC HEMOCCULT BLD/STL (HOME/3-CARD/SCREEN)
Card #2 Fecal Occult Blod, POC: NEGATIVE
Card #3 Fecal Occult Blood, POC: NEGATIVE
Fecal Occult Blood, POC: NEGATIVE

## 2018-02-20 DIAGNOSIS — L821 Other seborrheic keratosis: Secondary | ICD-10-CM | POA: Diagnosis not present

## 2018-02-20 DIAGNOSIS — D1801 Hemangioma of skin and subcutaneous tissue: Secondary | ICD-10-CM | POA: Diagnosis not present

## 2018-02-20 DIAGNOSIS — L814 Other melanin hyperpigmentation: Secondary | ICD-10-CM | POA: Diagnosis not present

## 2018-02-20 DIAGNOSIS — D225 Melanocytic nevi of trunk: Secondary | ICD-10-CM | POA: Diagnosis not present

## 2018-04-20 NOTE — Progress Notes (Signed)
MEDICARE ANNUAL WELLNESS VISIT AND FOLLOW UP Assessment:   Diagnoses and all orders for this visit:  Encounter for Medicare annual wellness exam  Essential hypertension Continue medication Monitor blood pressure at home; call if consistently over 130/80 Continue DASH diet.   Reminder to go to the ER if any CP, SOB, nausea, dizziness, severe HA, changes vision/speech, left arm numbness and tingling and jaw pain.  Gastroesophageal reflux disease, esophagitis presence not specified Well managed on current medications and by avoiding eating late Discussed diet, avoiding triggers and other lifestyle changes  Benign prostatic hyperplasia, unspecified whether lower urinary tract symptoms present Currently off of medications and asymptomatic  Vitamin D deficiency At goal at recent check; continue to recommend supplementation for goal of 70-100 Defer vitamin D level  Other abnormal glucose Recent A1Cs at goal Discussed diet/exercise, weight management  Defer A1C; check BMP  Mixed hyperlipidemia Continue medications Continue low cholesterol diet and exercise.  Check lipid panel.   Overweight (BMI 25.0-29.9) Long discussion about weight loss, diet, and exercise Recommended diet heavy in fruits and veggies and low in animal meats, cheeses, and dairy products, appropriate calorie intake Discussed appropriate weight for height  Start exercising - 150 min/week Follow up at next visit  Need for tetanus booster Td administered today   Over 30 minutes of exam, counseling, chart review, and critical decision making was performed  Future Appointments  Date Time Provider [place]  07/25/2018  9:30 AM Unk Pinto, MD [facility] None  01/30/2019  9:00 AM Unk Pinto, MD [facility] None     Plan:   During the course of the visit the patient was educated and counseled about appropriate screening and preventive services including:    Pneumococcal vaccine    Influenza vaccine  Prevnar 13  Td vaccine  Screening electrocardiogram  Colorectal cancer screening  Diabetes screening  Glaucoma screening  Nutrition counseling    Subjective:  Brendan Monroe is a 72 y.o. male who presents for Medicare Annual Wellness Visit and 3 month follow up for HTN, hyperlipidemia, glucose management, and vitamin D Def. He has hx of cerebral aneurysm repair in 1996 after which he has some mild short term memory loss which has been stable.  BMI is Body mass index is 26.11 kg/m., he has been working on diet and exercise - has cut out junk food.  Wt Readings from Last 3 Encounters:  04/21/18 182 lb (82.6 kg)  12/29/17 195 lb (88.5 kg)  10/03/17 195 lb 12.8 oz (88.8 kg)   His blood pressure has been controlled at home, today their BP is BP: 122/78 He does workout. He denies chest pain, shortness of breath, dizziness.   He is on cholesterol medication (simvastatin 40 mg daily) and denies myalgias. His cholesterol is at goal. The cholesterol last visit was:   Lab Results  Component Value Date   CHOL 173 12/29/2017   HDL 60 12/29/2017   LDLCALC 93 12/29/2017   TRIG 106 12/29/2017   CHOLHDL 2.9 12/29/2017   He has been working on diet and exercise for glucose managment, and denies foot ulcerations, increased appetite, nausea, paresthesia of the feet, polydipsia, polyuria, visual disturbances, vomiting and weight loss. Last A1C in the office was:  Lab Results  Component Value Date   HGBA1C 5.2 12/29/2017   Last GFR Lab Results  Component Value Date   GFRNONAA 61 12/29/2017    Patient is on Vitamin D supplement.   Lab Results  Component Value Date   VD25OH 32  12/29/2017      Medication Review:   Current Outpatient Medications (Cardiovascular):  .  atenolol (TENORMIN) 25 MG tablet, TAKE ONE TABLET BY MOUTH DAILY .  hydrochlorothiazide (HYDRODIURIL) 25 MG tablet, TAKE 1 TABLET (25 MG TOTAL) BY MOUTH DAILY .  simvastatin (ZOCOR) 40 MG  tablet, TAKE 1 TABLET (40 MG TOTAL) BY MOUTH EVERY EVENING.   Current Outpatient Medications (Analgesics):  .  aspirin 81 MG tablet, Take 81 mg by mouth daily.   Current Outpatient Medications (Other):  Marland Kitchen  Cholecalciferol (VITAMIN D PO), Take 2,000 Units by mouth 2 (two) times daily.  .  Omega-3 Fatty Acids (FISH OIL PO), Take by mouth daily. Marland Kitchen  omeprazole (PRILOSEC) 40 MG capsule, Take 1 capsule every morning for Acid Reflux  Allergies: No Known Allergies  Current Problems (verified) has Other abnormal glucose; Hyperlipidemia; Hypertension; GERD (gastroesophageal reflux disease); BPH (benign prostatic hyperplasia); Vitamin D deficiency; Medication management; and Overweight (BMI 25.0-29.9) on their problem list.  Screening Tests Immunization History  Administered Date(s) Administered  . Influenza, High Dose Seasonal PF 08/21/2014, 09/09/2015, 07/06/2016  . Pneumococcal Polysaccharide-23 06/05/2009  . Td 01/26/2007   Preventative care: Last colonoscopy: 07/2013 - Dr. Cristina Gong due 5 years  Prior vaccinations: TD or Tdap: 2008 DUE  Influenza: 2017  Pneumococcal: 2010 Prevnar13: out in office Shingles/Zostavax: Declines  Names of Other Physician/Practitioners you currently use: 1. [facility] here for primary care 2. Dr. Katy Fitch , eye doctor, last visit ?, goes every 2 years 3. Dr. Deboraha Sprang, dentist, last visit 2019, goes q 6 months  Patient Care Team: Unk Pinto, MD as PCP - General (Internal Medicine) Inda Castle, MD (Inactive) as Consulting Physician (Gastroenterology)  Surgical: He  has a past surgical history that includes Cerebral aneurysm repair (1996). Family His family history includes Alzheimer's disease in his mother; Cancer in his father; Diabetes in his sister. Social history  He reports that he quit smoking about 44 years ago. He has never used smokeless tobacco. He reports that he does not drink  alcohol or use drugs.  MEDICARE WELLNESS OBJECTIVES: Physical activity: Current Exercise Habits: Home exercise routine, Type of exercise: walking;treadmill, Time (Minutes): 30, Frequency (Times/Week): 3, Weekly Exercise (Minutes/Week): 90, Intensity: Mild, Exercise limited by: None identified Cardiac risk factors: Cardiac Risk Factors include: advanced age (>46men, >66 women);dyslipidemia;hypertension;male gender;smoking/ tobacco exposure Depression/mood screen:   Depression screen [facility] 2/9 04/21/2018  Decreased Interest 0  Down, Depressed, Hopeless 0  PHQ - 2 Score 0    ADLs:  In your present state of health, do you have any difficulty performing the following activities: 04/21/2018 07/13/2017  Hearing? N N  Vision? N N  Difficulty concentrating or making decisions? Y N  Comment Short term memory loss since aneurysm -  Walking or climbing stairs? N N  Dressing or bathing? N N  Doing errands, shopping? N N  Preparing Food and eating ? N -  Using the Toilet? N -  In the past six months, have you accidently leaked urine? N -  Do you have problems with loss of bowel control? N -  Managing your Medications? N -  Managing your Finances? N -  Housekeeping or managing your Housekeeping? N -  Some recent data might be hidden     Cognitive Testing  Alert? Yes  Normal Appearance?Yes  Oriented to person? Yes  Place? Yes   Time? Yes  Recall of three objects?  1/3  Can perform simple calculations? Yes  Displays appropriate judgment?Yes  Can read the correct time from a watch face?Yes  EOL planning: Does Patient Have a Medical Advance Directive?: Yes Type of Advance Directive: Living will Does patient want to make changes to medical advance directive?: No - Patient declined   Objective:   Today's Vitals   04/21/18 0837  BP: 122/78  Pulse: 68  Temp: 97.9 F (36.6 C)  SpO2: 99%  Weight: 182 lb (82.6 kg)  Height: 5\' 10"  (1.778 m)   Body mass index is 26.11 kg/m.  General  appearance: alert, no distress, WD/WN, male HEENT: normocephalic, sclerae anicteric, TMs pearly, nares patent, no discharge or erythema, pharynx normal Oral cavity: MMM, no lesions Neck: supple, no lymphadenopathy, no thyromegaly, no masses Heart: RRR, normal S1, S2, no murmurs Lungs: CTA bilaterally, no wheezes, rhonchi, or rales Abdomen: +bs, soft, non tender, non distended, no masses, no hepatomegaly, no splenomegaly Musculoskeletal: nontender, no swelling, no obvious deformity Extremities: no edema, no cyanosis, no clubbing Pulses: 2+ symmetric, upper and lower extremities, normal cap refill Neurological: alert, oriented x 3, CN2-12 intact, strength normal upper extremities and lower extremities, sensation normal throughout, DTRs 2+ throughout, no cerebellar signs, gait normal Psychiatric: normal affect, behavior normal, pleasant   Medicare Attestation I have personally reviewed: The patient's medical and social history Their use of alcohol, tobacco or illicit drugs Their current medications and supplements The patient's functional ability including ADLs,fall risks, home safety risks, cognitive, and hearing and visual impairment Diet and physical activities Evidence for depression or mood disorders  The patient's weight, height, BMI, and visual acuity have been recorded in the chart.  I have made referrals, counseling, and provided education to the patient based on review of the above and I have provided the patient with a written personalized care plan for preventive services.     Izora Ribas, NP   04/21/2018

## 2018-04-21 ENCOUNTER — Encounter: Payer: Self-pay | Admitting: Adult Health

## 2018-04-21 ENCOUNTER — Other Ambulatory Visit: Payer: Self-pay | Admitting: Internal Medicine

## 2018-04-21 ENCOUNTER — Ambulatory Visit (INDEPENDENT_AMBULATORY_CARE_PROVIDER_SITE_OTHER): Payer: PPO | Admitting: Adult Health

## 2018-04-21 VITALS — BP 122/78 | HR 68 | Temp 97.9°F | Ht 70.0 in | Wt 182.0 lb

## 2018-04-21 DIAGNOSIS — E663 Overweight: Secondary | ICD-10-CM | POA: Diagnosis not present

## 2018-04-21 DIAGNOSIS — Z79899 Other long term (current) drug therapy: Secondary | ICD-10-CM

## 2018-04-21 DIAGNOSIS — Z0001 Encounter for general adult medical examination with abnormal findings: Secondary | ICD-10-CM

## 2018-04-21 DIAGNOSIS — E559 Vitamin D deficiency, unspecified: Secondary | ICD-10-CM | POA: Diagnosis not present

## 2018-04-21 DIAGNOSIS — R6889 Other general symptoms and signs: Secondary | ICD-10-CM

## 2018-04-21 DIAGNOSIS — I10 Essential (primary) hypertension: Secondary | ICD-10-CM | POA: Diagnosis not present

## 2018-04-21 DIAGNOSIS — E782 Mixed hyperlipidemia: Secondary | ICD-10-CM

## 2018-04-21 DIAGNOSIS — R7309 Other abnormal glucose: Secondary | ICD-10-CM | POA: Diagnosis not present

## 2018-04-21 DIAGNOSIS — K219 Gastro-esophageal reflux disease without esophagitis: Secondary | ICD-10-CM

## 2018-04-21 DIAGNOSIS — N400 Enlarged prostate without lower urinary tract symptoms: Secondary | ICD-10-CM

## 2018-04-21 DIAGNOSIS — Z23 Encounter for immunization: Secondary | ICD-10-CM

## 2018-04-21 DIAGNOSIS — Z0000 Encounter for general adult medical examination without abnormal findings: Secondary | ICD-10-CM

## 2018-04-21 NOTE — Patient Instructions (Addendum)
Please bring by copies of advanced directives/living will  Please call Dr. Osborn Coho office to see if you still need the 5 year follow up colonoscopy this September  Aim for 7+ servings of fruits and vegetables daily  80+ fluid ounces of water or unsweet tea for healthy kidneys  Limit alcohol intake, avoid smoking  Limit animal fats in diet for cholesterol and heart health - choose grass fed whenever available  Aim for low stress - take time to unwind and care for your mental health  Aim for 150 min of moderate intensity exercise weekly for heart health, and weights twice weekly for bone health  Aim for 7-9 hours of sleep daily    Exercising to Stay Healthy Exercising regularly is important. It has many health benefits, such as:  Improving your overall fitness, flexibility, and endurance.  Increasing your bone density.  Helping with weight control.  Decreasing your body fat.  Increasing your muscle strength.  Reducing stress and tension.  Improving your overall health.  In order to become healthy and stay healthy, it is recommended that you do moderate-intensity and vigorous-intensity exercise. You can tell that you are exercising at a moderate intensity if you have a higher heart rate and faster breathing, but you are still able to hold a conversation. You can tell that you are exercising at a vigorous intensity if you are breathing much harder and faster and cannot hold a conversation while exercising. How often should I exercise? Choose an activity that you enjoy and set realistic goals. Your health care provider can help you to make an activity plan that works for you. Exercise regularly as directed by your health care provider. This may include:  Doing resistance training twice each week, such as: ? Push-ups. ? Sit-ups. ? Lifting weights. ? Using resistance bands.  Doing a given intensity of exercise for a given amount of time. Choose from these options: ? 150  minutes of moderate-intensity exercise every week. ? 75 minutes of vigorous-intensity exercise every week. ? A mix of moderate-intensity and vigorous-intensity exercise every week.  Children, pregnant women, people who are out of shape, people who are overweight, and older adults may need to consult a health care provider for individual recommendations. If you have any sort of medical condition, be sure to consult your health care provider before starting a new exercise program. What are some exercise ideas? Some moderate-intensity exercise ideas include:  Walking at a rate of 1 mile in 15 minutes.  Biking.  Hiking.  Golfing.  Dancing.  Some vigorous-intensity exercise ideas include:  Walking at a rate of at least 4.5 miles per hour.  Jogging or running at a rate of 5 miles per hour.  Biking at a rate of at least 10 miles per hour.  Lap swimming.  Roller-skating or in-line skating.  Cross-country skiing.  Vigorous competitive sports, such as football, basketball, and soccer.  Jumping rope.  Aerobic dancing.  What are some everyday activities that can help me to get exercise?  [place] work, such as: ? Pushing a Conservation officer, nature. ? Raking and bagging leaves.  Washing and waxing your car.  Pushing a stroller.  Shoveling snow.  Gardening.  Washing windows or floors. How can I be more active in my day-to-day activities?  Use the stairs instead of the elevator.  Take a walk during your lunch break.  If you drive, park your car farther away from work or school.  If you take public transportation, get off one stop  early and walk the rest of the way.  Make all of your phone calls while standing up and walking around.  Get up, stretch, and walk around every 30 minutes throughout the day. What guidelines should I follow while exercising?  Do not exercise so much that you hurt yourself, feel dizzy, or get very short of breath.  Consult your health care provider before  starting a new exercise program.  Wear comfortable clothes and shoes with good support.  Drink plenty of water while you exercise to prevent dehydration or heat stroke. Body water is lost during exercise and must be replaced.  Work out until you breathe faster and your heart beats faster. This information is not intended to replace advice given to you by your health care provider. Make sure you discuss any questions you have with your health care provider. Document Released: 11/27/2010 Document Revised: 04/01/2016 Document Reviewed: 03/28/2014 Elsevier Interactive Patient Education  [employer].

## 2018-04-22 LAB — COMPLETE METABOLIC PANEL WITH GFR
AG Ratio: 1.8 (calc) (ref 1.0–2.5)
ALT: 14 U/L (ref 9–46)
AST: 23 U/L (ref 10–35)
Albumin: 4.4 g/dL (ref 3.6–5.1)
Alkaline phosphatase (APISO): 79 U/L (ref 40–115)
BUN/Creatinine Ratio: 18 (calc) (ref 6–22)
BUN: 21 mg/dL (ref 7–25)
CO2: 31 mmol/L (ref 20–32)
Calcium: 9.5 mg/dL (ref 8.6–10.3)
Chloride: 101 mmol/L (ref 98–110)
Creat: 1.2 mg/dL — ABNORMAL HIGH (ref 0.70–1.18)
GFR, Est African American: 69 mL/min/{1.73_m2} (ref >=60)
GFR, Est Non African American: 59 mL/min/{1.73_m2} — ABNORMAL LOW (ref >=60)
Globulin: 2.4 g/dL (calc) (ref 1.9–3.7)
Glucose, Bld: 79 mg/dL (ref 65–99)
Potassium: 3.9 mmol/L (ref 3.5–5.3)
Sodium: 139 mmol/L (ref 135–146)
Total Bilirubin: 1 mg/dL (ref 0.2–1.2)
Total Protein: 6.8 g/dL (ref 6.1–8.1)

## 2018-04-22 LAB — CBC WITH DIFFERENTIAL/PLATELET
Basophils Absolute: 58 cells/uL (ref 0–200)
Basophils Relative: 1.1 %
Eosinophils Absolute: 191 cells/uL (ref 15–500)
Eosinophils Relative: 3.6 %
HCT: 43.6 % (ref 38.5–50.0)
Hemoglobin: 14.9 g/dL (ref 13.2–17.1)
Lymphs Abs: 1542 cells/uL (ref 850–3900)
MCH: 29.9 pg (ref 27.0–33.0)
MCHC: 34.2 g/dL (ref 32.0–36.0)
MCV: 87.6 fL (ref 80.0–100.0)
MPV: 9.9 fL (ref 7.5–12.5)
Monocytes Relative: 9.8 %
Neutro Abs: 2989 cells/uL (ref 1500–7800)
Neutrophils Relative %: 56.4 %
Platelets: 213 10*3/uL (ref 140–400)
RBC: 4.98 10*6/uL (ref 4.20–5.80)
RDW: 13.2 % (ref 11.0–15.0)
Total Lymphocyte: 29.1 %
WBC mixed population: 519 cells/uL (ref 200–950)
WBC: 5.3 10*3/uL (ref 3.8–10.8)

## 2018-04-22 LAB — LIPID PANEL
Cholesterol: 161 mg/dL (ref <200)
HDL: 58 mg/dL (ref >40)
LDL Cholesterol (Calc): 88 mg/dL (calc)
Non-HDL Cholesterol (Calc): 103 mg/dL (calc) (ref <130)
Total CHOL/HDL Ratio: 2.8 (calc) (ref <5.0)
Triglycerides: 66 mg/dL (ref <150)

## 2018-04-22 LAB — TSH: TSH: 3.51 mIU/L (ref 0.40–4.50)

## 2018-04-22 LAB — MAGNESIUM: Magnesium: 2 mg/dL (ref 1.5–2.5)

## 2018-06-01 ENCOUNTER — Other Ambulatory Visit: Payer: Self-pay | Admitting: Internal Medicine

## 2018-07-10 IMAGING — CR DG CHEST 2V
2 series · 2 of 2 positions shown · non-contrast
Comparison: 03/10/2011

CLINICAL DATA: Dry cough for 3-6 months, remote smoker, history
hypertension and GERD

EXAM:
CHEST  2 VIEW

[chest pa]
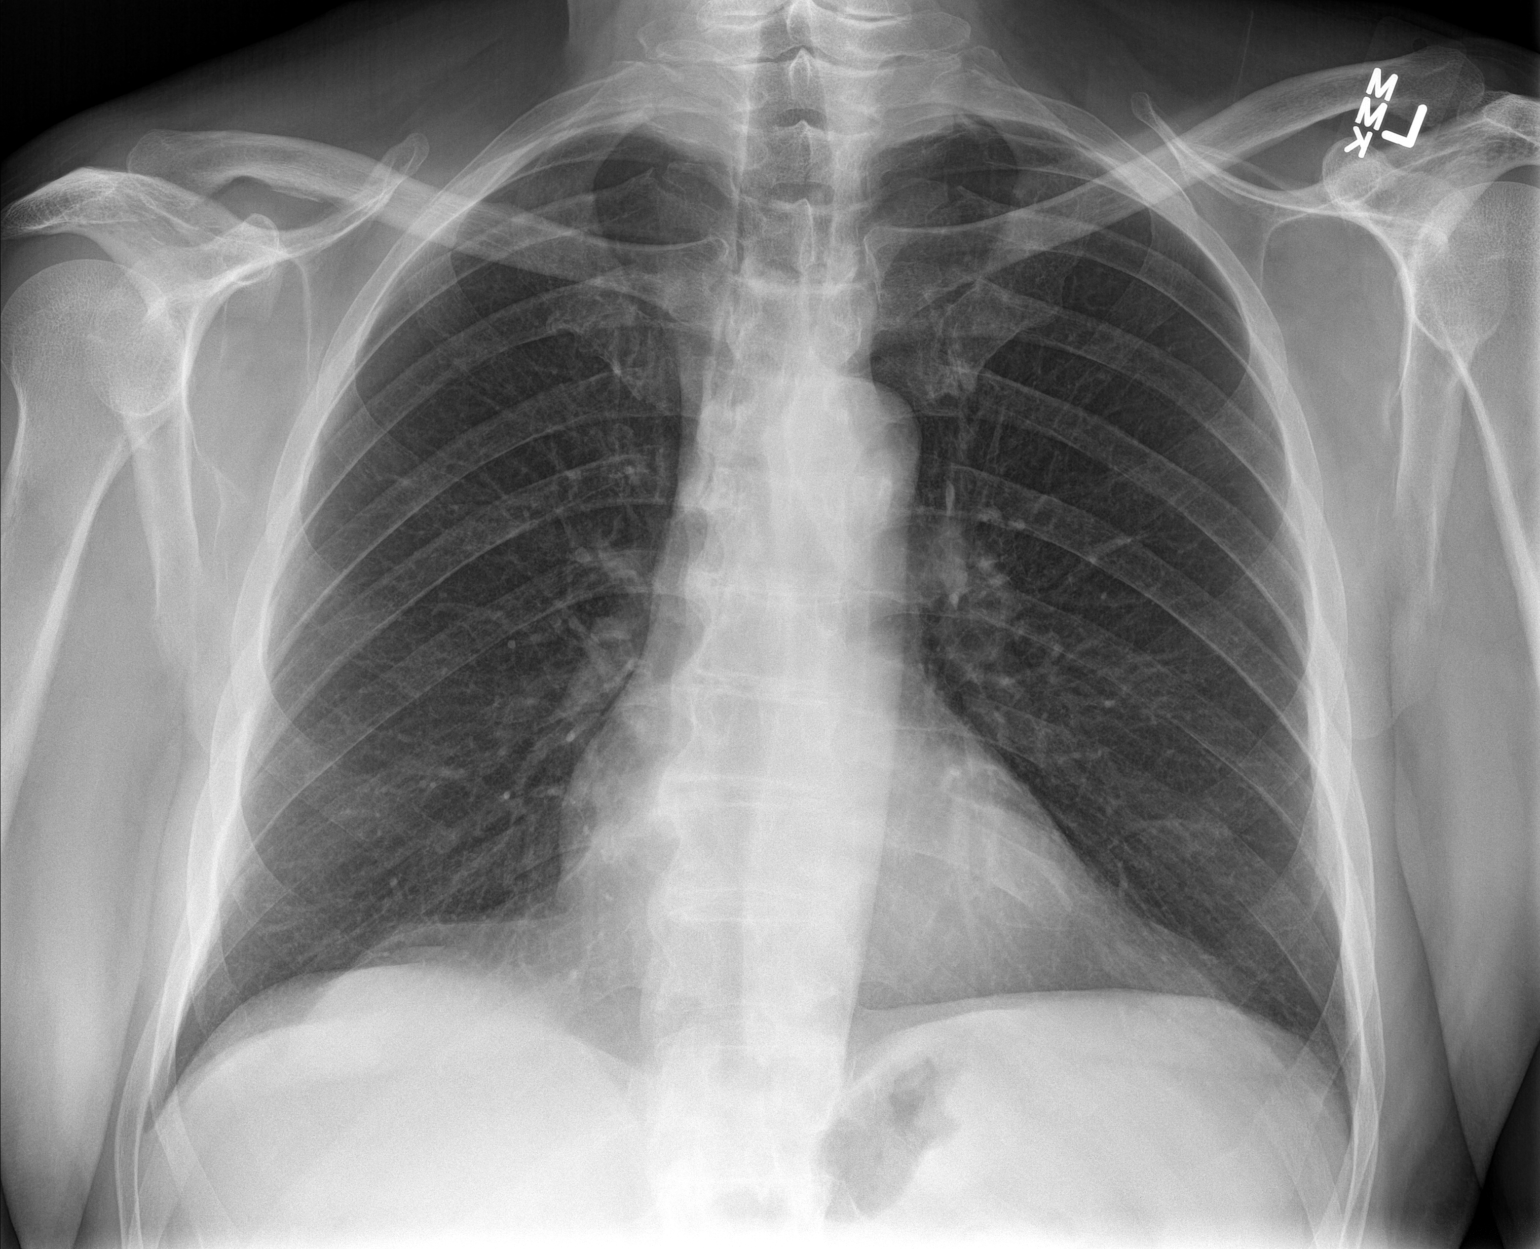

[chest lat]
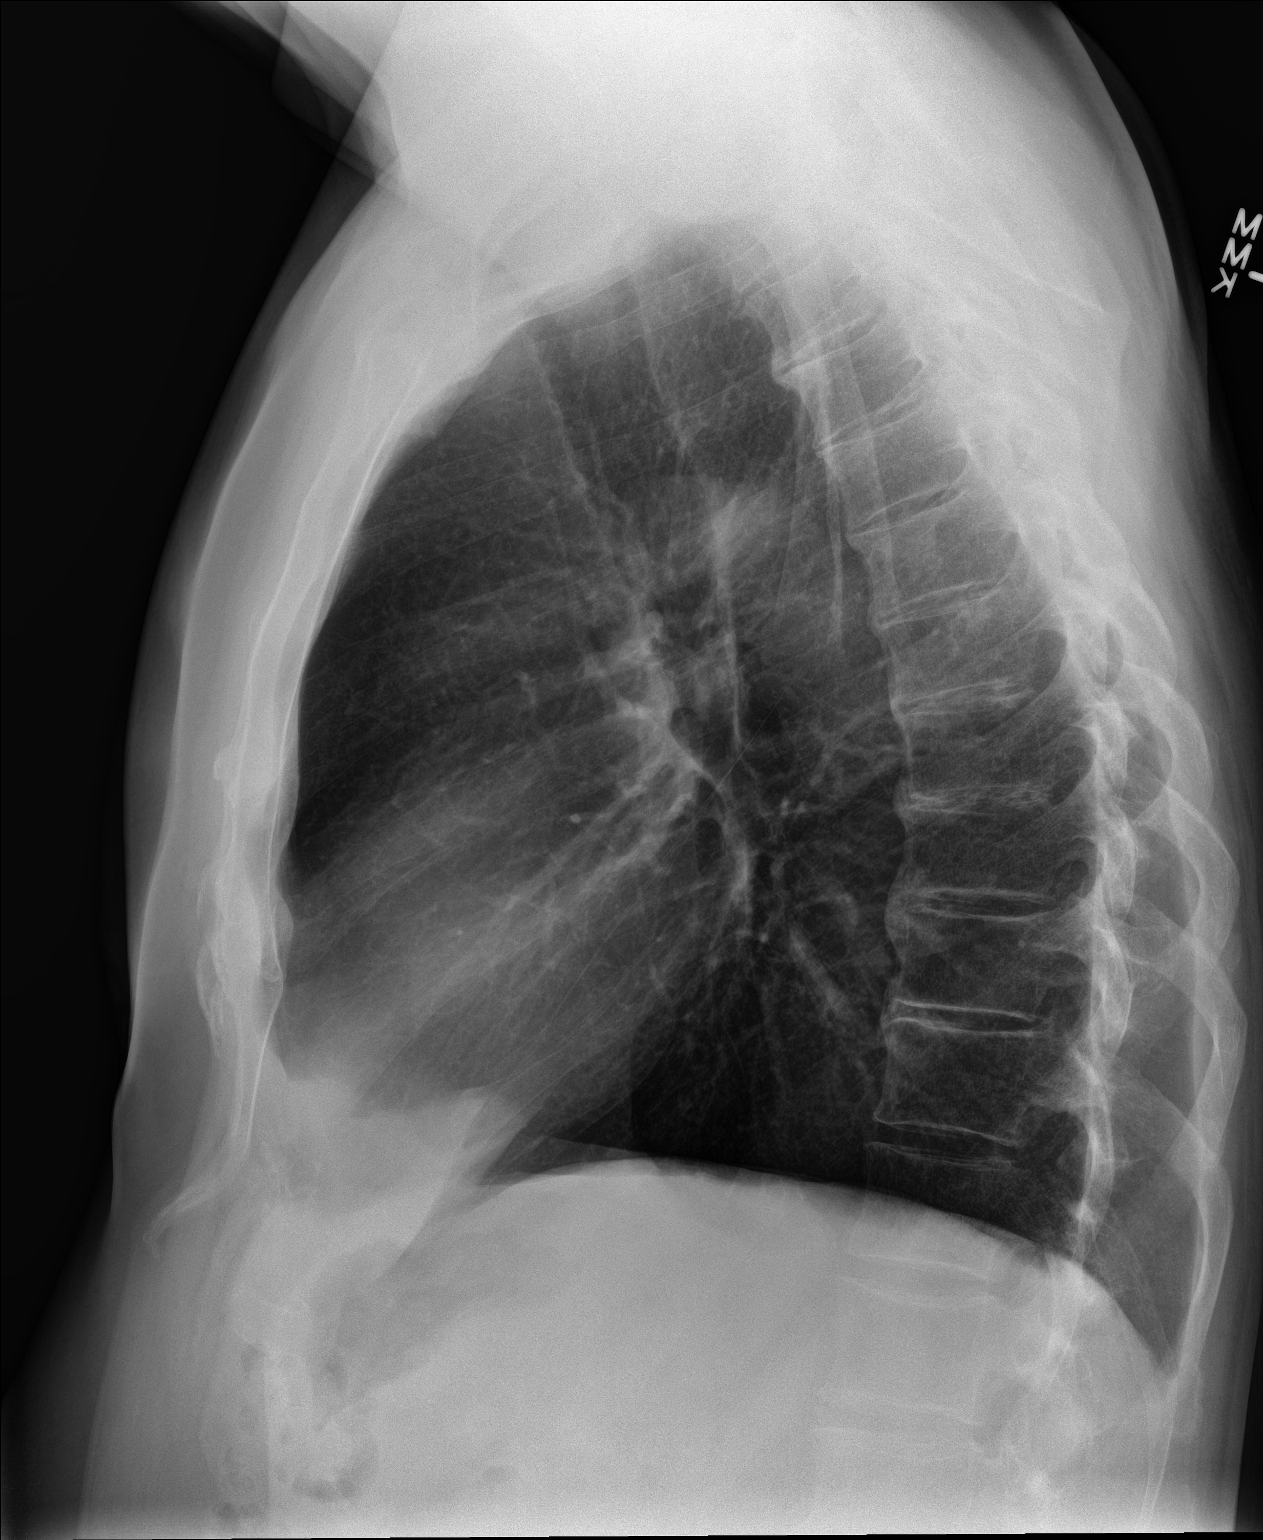

[2 of 2 positions shown; findings below may reference images not displayed]

FINDINGS: Normal heart size, mediastinal contours, and pulmonary vascularity.

Lungs clear.

No pleural effusion or pneumothorax.

No acute osseous findings.
IMPRESSION: No acute abnormalities.

## 2018-07-25 ENCOUNTER — Ambulatory Visit: Payer: Self-pay | Admitting: Internal Medicine

## 2018-09-06 DIAGNOSIS — Z23 Encounter for immunization: Secondary | ICD-10-CM | POA: Diagnosis not present

## 2018-10-10 DIAGNOSIS — D123 Benign neoplasm of transverse colon: Secondary | ICD-10-CM | POA: Diagnosis not present

## 2018-10-10 DIAGNOSIS — Z86010 Personal history of colonic polyps: Secondary | ICD-10-CM | POA: Diagnosis not present

## 2018-10-10 DIAGNOSIS — K5730 Diverticulosis of large intestine without perforation or abscess without bleeding: Secondary | ICD-10-CM | POA: Diagnosis not present

## 2018-10-10 LAB — HM COLONOSCOPY

## 2018-10-11 ENCOUNTER — Other Ambulatory Visit: Payer: Self-pay | Admitting: Internal Medicine

## 2018-10-11 ENCOUNTER — Other Ambulatory Visit: Payer: Self-pay | Admitting: *Deleted

## 2018-10-11 MED ORDER — HYDROCHLOROTHIAZIDE 25 MG PO TABS: 25.0000 mg | ORAL_TABLET | Freq: Every day | ORAL | 0 refills | Status: DC

## 2018-10-13 DIAGNOSIS — D123 Benign neoplasm of transverse colon: Secondary | ICD-10-CM | POA: Diagnosis not present

## 2018-10-26 ENCOUNTER — Encounter: Payer: Self-pay | Admitting: *Deleted

## 2018-11-26 ENCOUNTER — Other Ambulatory Visit: Payer: Self-pay | Admitting: Internal Medicine

## 2019-01-26 DIAGNOSIS — L814 Other melanin hyperpigmentation: Secondary | ICD-10-CM | POA: Diagnosis not present

## 2019-01-26 DIAGNOSIS — D225 Melanocytic nevi of trunk: Secondary | ICD-10-CM | POA: Diagnosis not present

## 2019-01-26 DIAGNOSIS — L570 Actinic keratosis: Secondary | ICD-10-CM | POA: Diagnosis not present

## 2019-01-26 DIAGNOSIS — L821 Other seborrheic keratosis: Secondary | ICD-10-CM | POA: Diagnosis not present

## 2019-01-29 ENCOUNTER — Encounter: Payer: Self-pay | Admitting: Internal Medicine

## 2019-01-29 NOTE — Patient Instructions (Signed)

## 2019-01-29 NOTE — Progress Notes (Signed)
[facility]   Unk Pinto, M.D.     Uvaldo Bristle. Silverio Lay, P.A.-C Liane Comber, [place]                [address] [place], [place] [address] Telephone [phone] Telefax [phone] Annual  Screening/Preventative Visit  & Comprehensive Evaluation & Examination     This very nice 73 y.o. MWM  presents for a Screening /Preventative Visit & comprehensive evaluation and management of multiple medical co-morbidities.  Patient has been followed for HTN, HLD, Prediabetes and Vitamin D Deficiency.     HTN predates since 1996. Patient's BP has been controlled at home.  Today's BP is at goal - 130/76. Patient denies any cardiac symptoms as chest pain, palpitations, shortness of breath, dizziness or ankle swelling.     Patient's hyperlipidemia is controlled with diet and medications. Patient denies myalgias or other medication SE's. Last lipids were at goal: Lab Results  Component Value Date   CHOL 161 04/21/2018   HDL 58 04/21/2018   LDLCALC 88 04/21/2018   TRIG 66 04/21/2018   CHOLHDL 2.8 04/21/2018      Patient has hx/o prediabetes  (A1c 5.9% / 2010)  and patient denies reactive hypoglycemic symptoms, visual blurring, diabetic polys or paresthesias. Last A1c was Normal & a6t goal: Lab Results  Component Value Date   HGBA1C 5.2 12/29/2017       Finally, patient has history of Vitamin D Deficiency  (A1c 5.9% / 2010)  and last vitamin D was at goal: Lab Results  Component Value Date   VD25OH 80 12/29/2017   Current Outpatient Medications on File Prior to Visit  Medication Sig  . aspirin 81 MG tablet Take 81 mg by mouth daily.  Marland Kitchen atenolol (TENORMIN) 25 MG tablet TAKE ONE TABLET BY MOUTH DAILY  . Cholecalciferol (VITAMIN D PO) Take 2,000 Units by mouth 2 (two) times daily.   . hydrochlorothiazide (HYDRODIURIL) 25 MG tablet Take 1 tablet (25 mg total) by mouth daily.  . Omega-3 Fatty Acids  (FISH OIL PO) Take by mouth daily.  Marland Kitchen omeprazole (PRILOSEC) 40 MG capsule Take 1 capsule every morning for Acid Reflux  . simvastatin (ZOCOR) 40 MG tablet TAKE 1 TABLET (40 MG TOTAL) BY MOUTH EVERY EVENING.   No current facility-administered medications on file prior to visit.    No Known Allergies   Past Medical History:  Diagnosis Date  . BPH (benign prostatic hyperplasia)   . GERD (gastroesophageal reflux disease)   . Hyperlipidemia   . Hypertension   . Prediabetes   . Vitamin D deficiency    Health Maintenance  Topic Date Due  . Hepatitis C Screening  August 22, 1946  . PNA vac Low Risk Adult (2 of 2 - PCV13) 06/05/2010  . INFLUENZA VACCINE  06/08/2018  . COLONOSCOPY  10/11/2023  . TETANUS/TDAP  04/21/2028   Immunization History  Administered Date(s) Administered  . Influenza, High Dose Seasonal PF 08/21/2014, 09/09/2015, 07/06/2016  . Influenza-Unspecified 08/08/2018  . Pneumococcal Polysaccharide-23 06/05/2009  . Td 01/26/2007, 04/21/2018   Last Colon - 10/10/2018 - Dr Romilda Garret - recc 5 yr f/u - due Dec 2024  Past Surgical History:  Procedure Laterality Date  . CEREBRAL ANEURYSM REPAIR  1996   Family History  Problem Relation Age of Onset  . Alzheimer's disease Mother   . Cancer Father  colon  . Diabetes Sister    Social History   Socioeconomic History  . Marital status: Married    Spouse name: Izora Gala  . Number of children: Not on file  Occupational History  . Retired  Tobacco Use  . Smoking status: Former Smoker    Last attempt to quit: 04/30/1974    Years since quitting: 44.7  . Smokeless tobacco: Never Used  Substance and Sexual Activity  . Alcohol use: No  . Drug use: No  . Sexual activity: Not on file    ROS Constitutional: Denies fever, chills, weight loss/gain, headaches, insomnia,  night sweats or change in appetite. Does c/o fatigue. Eyes: Denies redness, blurred vision, diplopia, discharge, itchy or watery eyes.  ENT: Denies discharge,  congestion, post nasal drip, epistaxis, sore throat, earache, hearing loss, dental pain, Tinnitus, Vertigo, Sinus pain or snoring.  Cardio: Denies chest pain, palpitations, irregular heartbeat, syncope, dyspnea, diaphoresis, orthopnea, PND, claudication or edema Respiratory: denies cough, dyspnea, DOE, pleurisy, hoarseness, laryngitis or wheezing.  Gastrointestinal: Denies dysphagia, heartburn, reflux, water brash, pain, cramps, nausea, vomiting, bloating, diarrhea, constipation, hematemesis, melena, hematochezia, jaundice or hemorrhoids Genitourinary: Denies dysuria, frequency, urgency, nocturia, hesitancy, discharge, hematuria or flank pain Musculoskeletal: Denies arthralgia, myalgia, stiffness, Jt. Swelling, pain, limp or strain/sprain. Denies Falls. Skin: Denies puritis, rash, hives, warts, acne, eczema or change in skin lesion Neuro: No weakness, tremor, incoordination, spasms, paresthesia or pain Psychiatric: Denies confusion, memory loss or sensory loss. Denies Depression. Endocrine: Denies change in weight, skin, hair change, nocturia, and paresthesia, diabetic polys, visual blurring or hyper / hypo glycemic episodes.  Heme/Lymph: No excessive bleeding, bruising or enlarged lymph nodes.  Physical Exam  BP 130/76   Pulse 64   Temp (!) 97 F (36.1 C)   Resp 16   Ht 5\' 11"  (1.803 m)   Wt 194 lb 3.2 oz (88.1 kg)   BMI 27.09 kg/m   General Appearance: Well nourished and well groomed and in no apparent distress.  Eyes: PERRLA, EOMs, conjunctiva no swelling or erythema, normal fundi and vessels. Sinuses: No frontal/maxillary tenderness ENT/Mouth: EACs patent / TMs  nl. Nares clear without erythema, swelling, mucoid exudates. Oral hygiene is good. No erythema, swelling, or exudate. Tongue normal, non-obstructing. Tonsils not swollen or erythematous. Hearing normal.  Neck: Supple, thyroid not palpable. No bruits, nodes or JVD. Respiratory: Respiratory effort normal.  BS equal and clear  bilateral without rales, rhonci, wheezing or stridor. Cardio: Heart sounds are normal with regular rate and rhythm and no murmurs, rubs or gallops. Peripheral pulses are normal and equal bilaterally without edema. No aortic or femoral bruits. Chest: symmetric with normal excursions and percussion.  Abdomen: Soft, with Nl bowel sounds. Nontender, no guarding, rebound, hernias, masses, or organomegaly.  Lymphatics: Non tender without lymphadenopathy.  Musculoskeletal: Full ROM all peripheral extremities, joint stability, 5/5 strength, and normal gait. Skin: Warm and dry without rashes, lesions, cyanosis, clubbing or  ecchymosis.  Neuro: Cranial nerves intact, reflexes equal bilaterally. Normal muscle tone, no cerebellar symptoms. Sensation intact.  Pysch: Alert and oriented X 3 with normal affect, insight and judgment appropriate.   Assessment and Plan  1. Annual Preventative/Screening Exam   2. Essential hypertension  - EKG 12-Lead - [place], RETROPERITNL ABD,  LTD - Urinalysis, Routine w reflex microscopic - Microalbumin / creatinine urine ratio - CBC with Differential/Platelet - COMPLETE METABOLIC PANEL WITH GFR - Magnesium - TSH  3. Hyperlipidemia, mixed  - EKG 12-Lead - [place], RETROPERITNL ABD,  LTD - Lipid panel -  TSH  4. Abnormal glucose  - EKG 12-Lead - [place], RETROPERITNL ABD,  LTD - Hemoglobin A1c - Insulin, random  5. Gastroesophageal reflux disease  - VITAMIN D 25 Hydroxyl  6. Prediabetes  - EKG 12-Lead - [place], RETROPERITNL ABD,  LTD - Hemoglobin A1c - Insulin, random  7. BPH with obstruction/lower urinary tract symptoms  - PSA  8. Screening for prostate cancer  - PSA  9. Screening for ischemic heart disease  - EKG 12-Lead  10. Former smoker  - EKG 12-Lead - [place], RETROPERITNL ABD,  LTD  11. Screening for AAA (aortic abdominal aneurysm)  - [place], RETROPERITNL ABD,  LTD  12. Medication management  - Urinalysis, Routine w reflex microscopic - PSA - CBC  with Differential/Platelet - COMPLETE METABOLIC PANEL WITH GFR - Magnesium - Lipid panel - TSH - Hemoglobin A1c - Insulin, random - VITAMIN D 25 Hydroxyl        Patient was counseled in prudent diet, weight control to achieve/maintain BMI less than 25, BP monitoring, regular exercise and medications as discussed.  Discussed med effects and SE's. Routine screening labs and tests as requested with regular follow-up as recommended. Over 40 minutes of exam, counseling, chart review and high complex critical decision making was performed

## 2019-01-30 ENCOUNTER — Other Ambulatory Visit: Payer: Self-pay

## 2019-01-30 ENCOUNTER — Encounter: Payer: Self-pay | Admitting: Internal Medicine

## 2019-01-30 ENCOUNTER — Ambulatory Visit (INDEPENDENT_AMBULATORY_CARE_PROVIDER_SITE_OTHER): Payer: PPO | Admitting: Internal Medicine

## 2019-01-30 VITALS — BP 130/76 | HR 64 | Temp 97.0°F | Resp 16 | Ht 71.0 in | Wt 194.2 lb

## 2019-01-30 DIAGNOSIS — Z0000 Encounter for general adult medical examination without abnormal findings: Secondary | ICD-10-CM

## 2019-01-30 DIAGNOSIS — N138 Other obstructive and reflux uropathy: Secondary | ICD-10-CM

## 2019-01-30 DIAGNOSIS — R7303 Prediabetes: Secondary | ICD-10-CM | POA: Diagnosis not present

## 2019-01-30 DIAGNOSIS — Z0001 Encounter for general adult medical examination with abnormal findings: Secondary | ICD-10-CM

## 2019-01-30 DIAGNOSIS — E782 Mixed hyperlipidemia: Secondary | ICD-10-CM | POA: Diagnosis not present

## 2019-01-30 DIAGNOSIS — R7309 Other abnormal glucose: Secondary | ICD-10-CM | POA: Diagnosis not present

## 2019-01-30 DIAGNOSIS — Z23 Encounter for immunization: Secondary | ICD-10-CM | POA: Diagnosis not present

## 2019-01-30 DIAGNOSIS — Z136 Encounter for screening for cardiovascular disorders: Secondary | ICD-10-CM | POA: Diagnosis not present

## 2019-01-30 DIAGNOSIS — I10 Essential (primary) hypertension: Secondary | ICD-10-CM

## 2019-01-30 DIAGNOSIS — N401 Enlarged prostate with lower urinary tract symptoms: Secondary | ICD-10-CM

## 2019-01-30 DIAGNOSIS — Z79899 Other long term (current) drug therapy: Secondary | ICD-10-CM

## 2019-01-30 DIAGNOSIS — Z87891 Personal history of nicotine dependence: Secondary | ICD-10-CM

## 2019-01-30 DIAGNOSIS — Z125 Encounter for screening for malignant neoplasm of prostate: Secondary | ICD-10-CM

## 2019-01-30 DIAGNOSIS — K219 Gastro-esophageal reflux disease without esophagitis: Secondary | ICD-10-CM

## 2019-01-31 LAB — MICROALBUMIN / CREATININE URINE RATIO
Creatinine, Urine: 84 mg/dL (ref 20–320)
Microalb Creat Ratio: 4 mcg/mg creat (ref <30)
Microalb, Ur: 0.3 mg/dL

## 2019-01-31 LAB — URINALYSIS, ROUTINE W REFLEX MICROSCOPIC
Bacteria, UA: NONE SEEN /HPF
Bilirubin Urine: NEGATIVE
Glucose, UA: NEGATIVE
Hyaline Cast: NONE SEEN /LPF
Ketones, ur: NEGATIVE
Leukocytes,Ua: NEGATIVE
Nitrite: NEGATIVE
Protein, ur: NEGATIVE
RBC / HPF: NONE SEEN /HPF (ref 0–2)
Specific Gravity, Urine: 1.018 (ref 1.001–1.03)
Squamous Epithelial / HPF: NONE SEEN /HPF (ref <=5)
WBC, UA: NONE SEEN /HPF (ref 0–5)
pH: 7 (ref 5.0–8.0)

## 2019-01-31 LAB — LIPID PANEL
Cholesterol: 165 mg/dL (ref <200)
HDL: 56 mg/dL (ref >=40)
LDL Cholesterol (Calc): 88 mg/dL (calc)
Non-HDL Cholesterol (Calc): 109 mg/dL (calc) (ref <130)
Total CHOL/HDL Ratio: 2.9 (calc) (ref <5.0)
Triglycerides: 110 mg/dL (ref <150)

## 2019-01-31 LAB — CBC WITH DIFFERENTIAL/PLATELET
Absolute Monocytes: 660 cells/uL (ref 200–950)
Basophils Absolute: 72 cells/uL (ref 0–200)
Basophils Relative: 1.2 %
Eosinophils Absolute: 312 cells/uL (ref 15–500)
Eosinophils Relative: 5.2 %
HCT: 43.1 % (ref 38.5–50.0)
Hemoglobin: 14.7 g/dL (ref 13.2–17.1)
Lymphs Abs: 1518 cells/uL (ref 850–3900)
MCH: 30.4 pg (ref 27.0–33.0)
MCHC: 34.1 g/dL (ref 32.0–36.0)
MCV: 89 fL (ref 80.0–100.0)
MPV: 10 fL (ref 7.5–12.5)
Monocytes Relative: 11 %
Neutro Abs: 3438 cells/uL (ref 1500–7800)
Neutrophils Relative %: 57.3 %
Platelets: 241 10*3/uL (ref 140–400)
RBC: 4.84 10*6/uL (ref 4.20–5.80)
RDW: 13.3 % (ref 11.0–15.0)
Total Lymphocyte: 25.3 %
WBC: 6 10*3/uL (ref 3.8–10.8)

## 2019-01-31 LAB — COMPLETE METABOLIC PANEL WITH GFR
AG Ratio: 1.8 (calc) (ref 1.0–2.5)
ALT: 17 U/L (ref 9–46)
AST: 23 U/L (ref 10–35)
Albumin: 4.2 g/dL (ref 3.6–5.1)
Alkaline phosphatase (APISO): 74 U/L (ref 35–144)
BUN/Creatinine Ratio: 17 (calc) (ref 6–22)
BUN: 20 mg/dL (ref 7–25)
CO2: 31 mmol/L (ref 20–32)
Calcium: 9.8 mg/dL (ref 8.6–10.3)
Chloride: 103 mmol/L (ref 98–110)
Creat: 1.21 mg/dL — ABNORMAL HIGH (ref 0.70–1.18)
GFR, Est African American: 68 mL/min/{1.73_m2} (ref >=60)
GFR, Est Non African American: 59 mL/min/{1.73_m2} — ABNORMAL LOW (ref >=60)
Globulin: 2.4 g/dL (calc) (ref 1.9–3.7)
Glucose, Bld: 85 mg/dL (ref 65–99)
Potassium: 4.2 mmol/L (ref 3.5–5.3)
Sodium: 140 mmol/L (ref 135–146)
Total Bilirubin: 0.8 mg/dL (ref 0.2–1.2)
Total Protein: 6.6 g/dL (ref 6.1–8.1)

## 2019-01-31 LAB — INSULIN, RANDOM: Insulin: 5.7 u[IU]/mL

## 2019-01-31 LAB — MAGNESIUM: Magnesium: 2.2 mg/dL (ref 1.5–2.5)

## 2019-01-31 LAB — VITAMIN D 25 HYDROXY (VIT D DEFICIENCY, FRACTURES): Vit D, 25-Hydroxy: 78 ng/mL (ref 30–100)

## 2019-01-31 LAB — HEMOGLOBIN A1C
Hgb A1c MFr Bld: 5.3 % of total Hgb (ref <5.7)
Mean Plasma Glucose: 105 (calc)
eAG (mmol/L): 5.8 (calc)

## 2019-01-31 LAB — TSH: TSH: 3.2 mIU/L (ref 0.40–4.50)

## 2019-01-31 LAB — PSA: PSA: 1 ng/mL (ref <=4.0)

## 2019-04-28 ENCOUNTER — Other Ambulatory Visit: Payer: Self-pay | Admitting: Internal Medicine

## 2019-05-01 NOTE — Progress Notes (Signed)
MEDICARE ANNUAL WELLNESS VISIT AND FOLLOW UP Assessment:   Diagnoses and all orders for this visit:  Encounter for Medicare annual wellness exam  Essential hypertension Continue medication Monitor blood pressure at home; call if consistently over 130/80 Continue DASH diet.   Reminder to go to the ER if any CP, SOB, nausea, dizziness, severe HA, changes vision/speech, left arm numbness and tingling and jaw pain.  Gastroesophageal reflux disease, esophagitis presence not specified Well managed on current medications and by avoiding eating late, he will verify med and discussed possible taper at next visit Discussed diet, avoiding triggers and other lifestyle changes  Benign prostatic hyperplasia, unspecified whether lower urinary tract symptoms present Currently off of medications and asymptomatic  Vitamin D deficiency At goal at recent check; continue to recommend supplementation for goal of 60-100 Defer vitamin D level  Other abnormal glucose Recent A1Cs at goal Discussed diet/exercise, weight management  Defer A1C; check CMP  Mixed hyperlipidemia Continue medications Continue low cholesterol diet and exercise.  Check lipid panel.   Overweight (BMI 25.0-29.9) Long discussion about weight loss, diet, and exercise Recommended diet heavy in fruits and veggies and low in animal meats, cheeses, and dairy products, appropriate calorie intake Discussed appropriate weight for height  Start exercising - 150 min/week Follow up at next visit  History of cerebral aneurysm repair Stable since 1996; control blood pressure  Over 30 minutes of exam, counseling, chart review, and critical decision making was performed  Future Appointments  Date Time Provider [place]  08/07/2019  9:30 AM Unk Pinto, MD [facility] None  02/11/2020  9:00 AM Unk Pinto, MD [facility] None     Plan:   During the course of the visit the patient was educated and counseled about  appropriate screening and preventive services including:    Pneumococcal vaccine   Influenza vaccine  Prevnar 13  Td vaccine  Screening electrocardiogram  Colorectal cancer screening  Diabetes screening  Glaucoma screening  Nutrition counseling    Subjective:  Brendan Monroe is a 73 y.o. male who presents for Medicare Annual Wellness Visit and 3 month follow up for HTN, hyperlipidemia, glucose management, and vitamin D Def.   He has hx of cerebral aneurysm repair in 1996 after which he has some mild short term memory loss which has been stable.  he has a diagnosis of GERD which is currently managed by omeprazole 40 mg but he is unsure if he is actually taking this medication  he reports symptoms is currently well controlled, and denies breakthrough reflux, burning in chest, hoarseness or cough.    BMI is Body mass index is 26.78 kg/m., he has been working on diet- has cut out junk food. Needs to restart walking with wife.  Wt Readings from Last 3 Encounters:  05/02/19 192 lb (87.1 kg)  01/30/19 194 lb 3.2 oz (88.1 kg)  04/21/18 182 lb (82.6 kg)   His blood pressure has been controlled at home, today their BP is BP: 126/76 He does workout. He denies chest pain, shortness of breath, dizziness.   He is on cholesterol medication (simvastatin 40 mg daily) and denies myalgias. His cholesterol is at goal. The cholesterol last visit was:   Lab Results  Component Value Date   CHOL 165 01/30/2019   HDL 56 01/30/2019   LDLCALC 88 01/30/2019   TRIG 110 01/30/2019   CHOLHDL 2.9 01/30/2019   He has been working on diet and exercise for glucose managment, and denies foot ulcerations, increased appetite, nausea, paresthesia  of the feet, polydipsia, polyuria, visual disturbances, vomiting and weight loss. Last A1C in the office was:  Lab Results  Component Value Date   HGBA1C 5.3 01/30/2019   Last GFR Lab Results  Component Value Date   GFRNONAA 59 (L) 01/30/2019    Patient  is on Vitamin D supplement.   Lab Results  Component Value Date   VD25OH 78 01/30/2019      Medication Review:   Current Outpatient Medications (Cardiovascular):  .  atenolol (TENORMIN) 25 MG tablet, TAKE ONE TABLET BY MOUTH DAILY .  hydrochlorothiazide (HYDRODIURIL) 25 MG tablet, Take 1 tablet (25 mg total) by mouth daily. .  simvastatin (ZOCOR) 40 MG tablet, Take 1 tablet every night for Cholesterol   Current Outpatient Medications (Analgesics):  .  aspirin 81 MG tablet, Take 81 mg by mouth daily.   Current Outpatient Medications (Other):  Marland Kitchen  Cholecalciferol (VITAMIN D PO), Take 2,000 Units by mouth 2 (two) times daily.  .  Omega-3 Fatty Acids (FISH OIL PO), Take by mouth daily. Marland Kitchen  omeprazole (PRILOSEC) 40 MG capsule, Take 1 capsule every morning for Acid Reflux  Allergies: No Known Allergies  Current Problems (verified) has Other abnormal glucose; Hyperlipidemia; Hypertension; GERD (gastroesophageal reflux disease); BPH (benign prostatic hyperplasia); Vitamin D deficiency; Medication management; and Overweight (BMI 25.0-29.9) on their problem list.  Screening Tests Immunization History  Administered Date(s) Administered  . Influenza, High Dose Seasonal PF 08/21/2014, 09/09/2015, 07/06/2016  . Influenza-Unspecified 08/08/2018  . Pneumococcal Conjugate-13 01/30/2019  . Pneumococcal Polysaccharide-23 06/05/2009  . Td 01/26/2007, 04/21/2018   Preventative care: Last colonoscopy: 10/2018 - Dr. Cristina Gong due 5 years  Prior vaccinations: TD or Tdap: 2019 Influenza: 2019  Pneumococcal: 2010 Prevnar13: 2020 Shingles/Zostavax: Declines  Names of Other Physician/Practitioners you currently use: 1. [facility] here for primary care 2. Dr. Katy Fitch , eye doctor, last visit 2019, goes every 2 years 3. Dr. Deboraha Sprang, dentist, last visit 2020, goes q 6 months  Patient Care Team: Unk Pinto, MD as PCP - General (Internal  Medicine) Inda Castle, MD (Inactive) as Consulting Physician (Gastroenterology)  Surgical: He  has a past surgical history that includes Cerebral aneurysm repair (1996). Family His family history includes Alzheimer's disease in his mother; Cancer in his father; Diabetes in his sister. Social history  He reports that he quit smoking about 45 years ago. He has never used smokeless tobacco. He reports that he does not drink alcohol or use drugs.  MEDICARE WELLNESS OBJECTIVES: Physical activity: Current Exercise Habits: The patient does not participate in regular exercise at present, Exercise limited by: None identified Cardiac risk factors: Cardiac Risk Factors include: advanced age (>55men, >48 women);hypertension;dyslipidemia;male gender;sedentary lifestyle;smoking/ tobacco exposure Depression/mood screen:   Depression screen [facility] 2/9 05/02/2019  Decreased Interest 0  Down, Depressed, Hopeless 0  PHQ - 2 Score 0    ADLs:  In your present state of health, do you have any difficulty performing the following activities: 05/02/2019 01/29/2019  Hearing? N N  Vision? N N  Difficulty concentrating or making decisions? N N  Walking or climbing stairs? N N  Dressing or bathing? N N  Doing errands, shopping? N N  Some recent data might be hidden     Cognitive Testing  Alert? Yes  Normal Appearance?Yes  Oriented to person? Yes  Place? Yes   Time? Yes  Recall of three objects?  1/3  Can perform simple calculations? Yes  Displays appropriate judgment?Yes  Can read the  correct time from a watch face?Yes  EOL planning: Does Patient Have a Medical Advance Directive?: Yes Type of Advance Directive: Living will, Healthcare Power of Attorney Does patient want to make changes to medical advance directive?: No - Patient declined Copy of [place] in Chart?: No - copy requested   Objective:   Today's Vitals   05/02/19 0854  BP: 126/76  Pulse: (!) 59  Temp: (!) 97.3 F  (36.3 C)  SpO2: 99%  Weight: 192 lb (87.1 kg)  Height: 5\' 11"  (1.803 m)   Body mass index is 26.78 kg/m.  General appearance: alert, no distress, WD/WN, male HEENT: normocephalic, sclerae anicteric, TMs pearly, nares patent, no discharge or erythema, pharynx normal Oral cavity: MMM, no lesions Neck: supple, no lymphadenopathy, no thyromegaly, no masses Heart: RRR, normal S1, S2, no murmurs Lungs: CTA bilaterally, no wheezes, rhonchi, or rales Abdomen: +bs, soft, non tender, non distended, no masses, no hepatomegaly, no splenomegaly Musculoskeletal: nontender, no swelling, no obvious deformity Extremities: no edema, no cyanosis, no clubbing Pulses: 2+ symmetric, upper and lower extremities, normal cap refill Neurological: alert, oriented x 3, CN2-12 intact, strength normal upper extremities and lower extremities, sensation normal throughout, DTRs 2+ throughout, no cerebellar signs, gait normal Psychiatric: normal affect, behavior normal, pleasant   Medicare Attestation I have personally reviewed: The patient's medical and social history Their use of alcohol, tobacco or illicit drugs Their current medications and supplements The patient's functional ability including ADLs,fall risks, home safety risks, cognitive, and hearing and visual impairment Diet and physical activities Evidence for depression or mood disorders  The patient's weight, height, BMI, and visual acuity have been recorded in the chart.  I have made referrals, counseling, and provided education to the patient based on review of the above and I have provided the patient with a written personalized care plan for preventive services.     Izora Ribas, NP   05/02/2019

## 2019-05-02 ENCOUNTER — Other Ambulatory Visit: Payer: Self-pay

## 2019-05-02 ENCOUNTER — Ambulatory Visit (INDEPENDENT_AMBULATORY_CARE_PROVIDER_SITE_OTHER): Payer: PPO | Admitting: Adult Health

## 2019-05-02 ENCOUNTER — Encounter: Payer: Self-pay | Admitting: Adult Health

## 2019-05-02 VITALS — BP 126/76 | HR 59 | Temp 97.3°F | Ht 71.0 in | Wt 192.0 lb

## 2019-05-02 DIAGNOSIS — Z9889 Other specified postprocedural states: Secondary | ICD-10-CM

## 2019-05-02 DIAGNOSIS — K219 Gastro-esophageal reflux disease without esophagitis: Secondary | ICD-10-CM

## 2019-05-02 DIAGNOSIS — Z0000 Encounter for general adult medical examination without abnormal findings: Secondary | ICD-10-CM

## 2019-05-02 DIAGNOSIS — E663 Overweight: Secondary | ICD-10-CM

## 2019-05-02 DIAGNOSIS — Z0001 Encounter for general adult medical examination with abnormal findings: Secondary | ICD-10-CM | POA: Diagnosis not present

## 2019-05-02 DIAGNOSIS — N400 Enlarged prostate without lower urinary tract symptoms: Secondary | ICD-10-CM | POA: Diagnosis not present

## 2019-05-02 DIAGNOSIS — Z8679 Personal history of other diseases of the circulatory system: Secondary | ICD-10-CM | POA: Insufficient documentation

## 2019-05-02 DIAGNOSIS — E782 Mixed hyperlipidemia: Secondary | ICD-10-CM

## 2019-05-02 DIAGNOSIS — I10 Essential (primary) hypertension: Secondary | ICD-10-CM

## 2019-05-02 DIAGNOSIS — R6889 Other general symptoms and signs: Secondary | ICD-10-CM

## 2019-05-02 DIAGNOSIS — E559 Vitamin D deficiency, unspecified: Secondary | ICD-10-CM

## 2019-05-02 DIAGNOSIS — Z79899 Other long term (current) drug therapy: Secondary | ICD-10-CM | POA: Diagnosis not present

## 2019-05-02 DIAGNOSIS — R7309 Other abnormal glucose: Secondary | ICD-10-CM | POA: Diagnosis not present

## 2019-05-02 NOTE — Patient Instructions (Signed)
  Brendan Monroe , Thank you for taking time to come for your Medicare Wellness Visit. I appreciate your ongoing commitment to your health goals. Please review the following plan we discussed and let me know if I can assist you in the future.   These are the goals we discussed: Goals    . Exercise 150 min/wk Moderate Activity       This is a list of the screening recommended for you and due dates:  Health Maintenance  Topic Date Due  .  Hepatitis C: One time screening is recommended by Center for Disease Control  (CDC) for  adults born from [birth year] through 1967.   Nov 07, 1946  . Flu Shot  06/09/2019  . Colon Cancer Screening  10/11/2023  . Tetanus Vaccine  04/21/2028  . Pneumonia vaccines  Completed

## 2019-05-03 LAB — CBC WITH DIFFERENTIAL/PLATELET
Absolute Monocytes: 583 cells/uL (ref 200–950)
Basophils Absolute: 81 cells/uL (ref 0–200)
Basophils Relative: 1.5 %
Eosinophils Absolute: 248 cells/uL (ref 15–500)
Eosinophils Relative: 4.6 %
HCT: 43.1 % (ref 38.5–50.0)
Hemoglobin: 14.7 g/dL (ref 13.2–17.1)
Lymphs Abs: 1604 cells/uL (ref 850–3900)
MCH: 30.9 pg (ref 27.0–33.0)
MCHC: 34.1 g/dL (ref 32.0–36.0)
MCV: 90.7 fL (ref 80.0–100.0)
MPV: 10 fL (ref 7.5–12.5)
Monocytes Relative: 10.8 %
Neutro Abs: 2884 cells/uL (ref 1500–7800)
Neutrophils Relative %: 53.4 %
Platelets: 215 10*3/uL (ref 140–400)
RBC: 4.75 10*6/uL (ref 4.20–5.80)
RDW: 13.1 % (ref 11.0–15.0)
Total Lymphocyte: 29.7 %
WBC: 5.4 10*3/uL (ref 3.8–10.8)

## 2019-05-03 LAB — COMPLETE METABOLIC PANEL WITH GFR
AG Ratio: 1.8 (calc) (ref 1.0–2.5)
ALT: 15 U/L (ref 9–46)
AST: 22 U/L (ref 10–35)
Albumin: 4.1 g/dL (ref 3.6–5.1)
Alkaline phosphatase (APISO): 80 U/L (ref 35–144)
BUN: 21 mg/dL (ref 7–25)
CO2: 29 mmol/L (ref 20–32)
Calcium: 9.1 mg/dL (ref 8.6–10.3)
Chloride: 108 mmol/L (ref 98–110)
Creat: 1.1 mg/dL (ref 0.70–1.18)
GFR, Est African American: 76 mL/min/{1.73_m2} (ref >=60)
GFR, Est Non African American: 65 mL/min/{1.73_m2} (ref >=60)
Globulin: 2.3 g/dL (calc) (ref 1.9–3.7)
Glucose, Bld: 85 mg/dL (ref 65–99)
Potassium: 4.2 mmol/L (ref 3.5–5.3)
Sodium: 142 mmol/L (ref 135–146)
Total Bilirubin: 0.7 mg/dL (ref 0.2–1.2)
Total Protein: 6.4 g/dL (ref 6.1–8.1)

## 2019-05-03 LAB — LIPID PANEL
Cholesterol: 182 mg/dL (ref <200)
HDL: 56 mg/dL (ref >=40)
LDL Cholesterol (Calc): 105 mg/dL (calc) — ABNORMAL HIGH
Non-HDL Cholesterol (Calc): 126 mg/dL (calc) (ref <130)
Total CHOL/HDL Ratio: 3.3 (calc) (ref <5.0)
Triglycerides: 112 mg/dL (ref <150)

## 2019-05-03 LAB — TSH: TSH: 3.85 mIU/L (ref 0.40–4.50)

## 2019-05-03 LAB — MAGNESIUM: Magnesium: 2.2 mg/dL (ref 1.5–2.5)

## 2019-08-06 ENCOUNTER — Encounter: Payer: Self-pay | Admitting: Internal Medicine

## 2019-08-06 NOTE — Progress Notes (Signed)
History of Present Illness:      This very nice 73 y.o.  MWM presents for 6 month follow up with HTN, HLD, Pre-Diabetes and Vitamin D Deficiency. Patient has GERD controlled on his meds.      Patient is treated for HTN  (1996) & BP has been controlled at home. Today's BP is at goal - 122/76. Patient has had no complaints of any cardiac type chest pain, palpitations, dyspnea / orthopnea / PND, dizziness, claudication, or dependent edema.      Hyperlipidemia is near controlled with diet & meds. Patient denies myalgias or other med SE's. Last Lipids were near goal:   Lab Results  Component Value Date   CHOL 182 05/02/2019   HDL 56 05/02/2019   LDLCALC 105 (H) 05/02/2019   TRIG 112 05/02/2019   CHOLHDL 3.3 05/02/2019       Also, the patient has history of PreDiabetes (A1c 5.9% / 2010)  and has had no symptoms of reactive hypoglycemia, diabetic polys, paresthesias or visual blurring.  Last A1c was Normal & at goal:  Lab Results  Component Value Date   HGBA1C 5.3 01/30/2019       Further, the patient also has history of Vitamin D Deficiency ("41" / 2008)  and supplements vitamin D without any suspected side-effects. Last vitamin D was at goal: Lab Results  Component Value Date   VD25OH 78 01/30/2019   Current Outpatient Medications on File Prior to Visit  Medication Sig  . aspirin 81 MG tablet Take 81 mg by mouth daily.  Marland Kitchen atenolol (TENORMIN) 25 MG tablet TAKE ONE TABLET BY MOUTH DAILY  . Cholecalciferol (VITAMIN D PO) Take 2,000 Units by mouth 2 (two) times daily.   . hydrochlorothiazide (HYDRODIURIL) 25 MG tablet Take 1 tablet (25 mg total) by mouth daily.  . Omega-3 Fatty Acids (FISH OIL PO) Take by mouth daily.  Marland Kitchen omeprazole (PRILOSEC) 40 MG capsule Take 1 capsule every morning for Acid Reflux  . simvastatin (ZOCOR) 40 MG tablet Take 1 tablet every night for Cholesterol   No current facility-administered medications on file prior to visit.    No Known Allergies  PMHx:    Past Medical History:  Diagnosis Date  . BPH (benign prostatic hyperplasia)   . GERD (gastroesophageal reflux disease)   . Hyperlipidemia   . Hypertension   . Prediabetes   . Vitamin D deficiency    Immunization History  Administered Date(s) Administered  . Influenza, High Dose Seasonal PF 08/21/2014, 09/09/2015, 07/06/2016  . Influenza-Unspecified 08/08/2018  . Pneumococcal Conjugate-13 01/30/2019  . Pneumococcal Polysaccharide-23 06/05/2009  . Td 01/26/2007, 04/21/2018   Past Surgical History:  Procedure Laterality Date  . CEREBRAL ANEURYSM REPAIR  1996   FHx:    Reviewed / unchanged  SHx:    Reviewed / unchanged   Systems Review:  Constitutional: Denies fever, chills, wt changes, headaches, insomnia, fatigue, night sweats, change in appetite. Eyes: Denies redness, blurred vision, diplopia, discharge, itchy, watery eyes.  ENT: Denies discharge, congestion, post nasal drip, epistaxis, sore throat, earache, hearing loss, dental pain, tinnitus, vertigo, sinus pain, snoring.  CV: Denies chest pain, palpitations, irregular heartbeat, syncope, dyspnea, diaphoresis, orthopnea, PND, claudication or edema. Respiratory: denies cough, dyspnea, DOE, pleurisy, hoarseness, laryngitis, wheezing.  Gastrointestinal: Denies dysphagia, odynophagia, heartburn, reflux, water brash, abdominal pain or cramps, nausea, vomiting, bloating, diarrhea, constipation, hematemesis, melena, hematochezia  or hemorrhoids. Genitourinary: Denies dysuria, frequency, urgency, nocturia, hesitancy, discharge, hematuria or flank pain. Musculoskeletal: Denies  arthralgias, myalgias, stiffness, jt. swelling, pain, limping or strain/sprain.  Skin: Denies pruritus, rash, hives, warts, acne, eczema or change in skin lesion(s). Neuro: No weakness, tremor, incoordination, spasms, paresthesia or pain. Psychiatric: Denies confusion, memory loss or sensory loss. Endo: Denies change in weight, skin or hair change.  Heme/Lymph:  No excessive bleeding, bruising or enlarged lymph nodes.  Physical Exam  BP 122/76   Pulse 60   Temp (!) 97.4 F (36.3 C)   Resp 16   Ht 5\' 10"  (1.778 m)   Wt 192 lb 6.4 oz (87.3 kg)   BMI 27.61 kg/m   Appears  well nourished, well groomed  and in no distress.  Eyes: PERRLA, EOMs, conjunctiva no swelling or erythema. Sinuses: No frontal/maxillary tenderness ENT/Mouth: EAC's clear, TM's nl w/o erythema, bulging. Nares clear w/o erythema, swelling, exudates. Oropharynx clear without erythema or exudates. Oral hygiene is good. Tongue normal, non obstructing. Hearing intact.  Neck: Supple. Thyroid not palpable. Car 2+/2+ without bruits, nodes or JVD. Chest: Respirations nl with BS clear & equal w/o rales, rhonchi, wheezing or stridor.  Cor: Heart sounds normal w/ regular rate and rhythm without sig. murmurs, gallops, clicks or rubs. Peripheral pulses normal and equal  without edema.  Abdomen: Soft & bowel sounds normal. Non-tender w/o guarding, rebound, hernias, masses or organomegaly.  Lymphatics: Unremarkable.  Musculoskeletal: Full ROM all peripheral extremities, joint stability, 5/5 strength and normal gait.  Skin: Warm, dry without exposed rashes, lesions or ecchymosis apparent.  Neuro: Cranial nerves intact, reflexes equal bilaterally. Sensory-motor testing grossly intact. Tendon reflexes grossly intact.  Pysch: Alert & oriented x 3.  Insight and judgement nl & appropriate. No ideations.  Assessment and Plan:  1. Essential hypertension  - Continue medication, monitor blood pressure at home.  - Continue DASH diet.  Reminder to go to the ER if any CP,  SOB, nausea, dizziness, severe HA, changes vision/speech.  - CBC with Differential/Platelet - COMPLETE METABOLIC PANEL WITH GFR - Magnesium - TSH  2. Hyperlipidemia, mixed  - Continue diet/meds, exercise,& lifestyle modifications.  - Continue monitor periodic cholesterol/liver & renal functions   - Lipid panel - TSH   3. Abnormal glucose  - Continue diet, exercise  - Lifestyle modifications.  - Monitor appropriate labs.  - Hemoglobin A1c - Insulin, random  4. Vitamin D deficiency  - Continue supplementation.  - VITAMIN D 25 Hydroxyl  5. Prediabetes  - Hemoglobin A1c - Insulin, random  6. Gastroesophageal reflux disease  - CBC with Differential/Platelet  7. Medication management  - CBC with Differential/Platelet - COMPLETE METABOLIC PANEL WITH GFR - Magnesium - Lipid panel - TSH - Hemoglobin A1c - Insulin, random - VITAMIN D 25 Hydroxyl        Discussed  regular exercise, BP monitoring, weight control to achieve/maintain BMI less than 25 and discussed med and SE's. Recommended labs to assess and monitor clinical status with further disposition pending results of labs.  I discussed the assessment and treatment plan with the patient. The patient was provided an opportunity to ask questions and all were answered. The patient agreed with the plan and demonstrated an understanding of the instructions.  I provided over 30 minutes of exam, counseling, chart review and  complex critical decision making.  Kirtland Bouchard, MD

## 2019-08-06 NOTE — Patient Instructions (Signed)

## 2019-08-07 ENCOUNTER — Ambulatory Visit (INDEPENDENT_AMBULATORY_CARE_PROVIDER_SITE_OTHER): Payer: PPO | Admitting: Internal Medicine

## 2019-08-07 ENCOUNTER — Other Ambulatory Visit: Payer: Self-pay

## 2019-08-07 VITALS — BP 122/76 | HR 60 | Temp 97.4°F | Resp 16 | Ht 70.0 in | Wt 192.4 lb

## 2019-08-07 DIAGNOSIS — E782 Mixed hyperlipidemia: Secondary | ICD-10-CM | POA: Diagnosis not present

## 2019-08-07 DIAGNOSIS — E559 Vitamin D deficiency, unspecified: Secondary | ICD-10-CM

## 2019-08-07 DIAGNOSIS — I10 Essential (primary) hypertension: Secondary | ICD-10-CM

## 2019-08-07 DIAGNOSIS — R7309 Other abnormal glucose: Secondary | ICD-10-CM

## 2019-08-07 DIAGNOSIS — K219 Gastro-esophageal reflux disease without esophagitis: Secondary | ICD-10-CM

## 2019-08-07 DIAGNOSIS — R7303 Prediabetes: Secondary | ICD-10-CM | POA: Diagnosis not present

## 2019-08-07 DIAGNOSIS — Z79899 Other long term (current) drug therapy: Secondary | ICD-10-CM | POA: Diagnosis not present

## 2019-08-08 LAB — LIPID PANEL
Cholesterol: 178 mg/dL (ref <200)
HDL: 55 mg/dL (ref >=40)
LDL Cholesterol (Calc): 104 mg/dL (calc) — ABNORMAL HIGH
Non-HDL Cholesterol (Calc): 123 mg/dL (calc) (ref <130)
Total CHOL/HDL Ratio: 3.2 (calc) (ref <5.0)
Triglycerides: 94 mg/dL (ref <150)

## 2019-08-08 LAB — CBC WITH DIFFERENTIAL/PLATELET
Absolute Monocytes: 505 cells/uL (ref 200–950)
Basophils Absolute: 82 cells/uL (ref 0–200)
Basophils Relative: 1.6 %
Eosinophils Absolute: 209 cells/uL (ref 15–500)
Eosinophils Relative: 4.1 %
HCT: 45.5 % (ref 38.5–50.0)
Hemoglobin: 15.3 g/dL (ref 13.2–17.1)
Lymphs Abs: 1433 cells/uL (ref 850–3900)
MCH: 30.1 pg (ref 27.0–33.0)
MCHC: 33.6 g/dL (ref 32.0–36.0)
MCV: 89.4 fL (ref 80.0–100.0)
MPV: 9.5 fL (ref 7.5–12.5)
Monocytes Relative: 9.9 %
Neutro Abs: 2871 cells/uL (ref 1500–7800)
Neutrophils Relative %: 56.3 %
Platelets: 217 10*3/uL (ref 140–400)
RBC: 5.09 10*6/uL (ref 4.20–5.80)
RDW: 13.1 % (ref 11.0–15.0)
Total Lymphocyte: 28.1 %
WBC: 5.1 10*3/uL (ref 3.8–10.8)

## 2019-08-08 LAB — COMPLETE METABOLIC PANEL WITH GFR
AG Ratio: 1.8 (calc) (ref 1.0–2.5)
ALT: 18 U/L (ref 9–46)
AST: 26 U/L (ref 10–35)
Albumin: 4.4 g/dL (ref 3.6–5.1)
Alkaline phosphatase (APISO): 72 U/L (ref 35–144)
BUN: 22 mg/dL (ref 7–25)
CO2: 28 mmol/L (ref 20–32)
Calcium: 9.9 mg/dL (ref 8.6–10.3)
Chloride: 103 mmol/L (ref 98–110)
Creat: 1.18 mg/dL (ref 0.70–1.18)
GFR, Est African American: 70 mL/min/{1.73_m2} (ref >=60)
GFR, Est Non African American: 60 mL/min/{1.73_m2} (ref >=60)
Globulin: 2.4 g/dL (calc) (ref 1.9–3.7)
Glucose, Bld: 90 mg/dL (ref 65–99)
Potassium: 4.2 mmol/L (ref 3.5–5.3)
Sodium: 140 mmol/L (ref 135–146)
Total Bilirubin: 0.9 mg/dL (ref 0.2–1.2)
Total Protein: 6.8 g/dL (ref 6.1–8.1)

## 2019-08-08 LAB — VITAMIN D 25 HYDROXY (VIT D DEFICIENCY, FRACTURES): Vit D, 25-Hydroxy: 61 ng/mL (ref 30–100)

## 2019-08-08 LAB — INSULIN, RANDOM: Insulin: 4.7 u[IU]/mL

## 2019-08-08 LAB — TSH: TSH: 3.05 mIU/L (ref 0.40–4.50)

## 2019-08-08 LAB — HEMOGLOBIN A1C
Hgb A1c MFr Bld: 5.3 % of total Hgb (ref <5.7)
Mean Plasma Glucose: 105 (calc)
eAG (mmol/L): 5.8 (calc)

## 2019-08-08 LAB — MAGNESIUM: Magnesium: 2.1 mg/dL (ref 1.5–2.5)

## 2019-10-21 ENCOUNTER — Other Ambulatory Visit: Payer: Self-pay | Admitting: Internal Medicine

## 2019-11-05 NOTE — Progress Notes (Signed)
Assessment and Plan:   [facility]ANTS COPY OF [facility] WILL COME PICK UP HERE    Hypertension -Continue medication, monitor blood pressure at home. Continue DASH diet.  Reminder to go to the ER if any CP, SOB, nausea, dizziness, severe HA, changes vision/speech, left arm numbness and tingling and jaw pain.  Cholesterol -Continue diet and exercise. Check cholesterol.    Prediabetes  -Continue diet and exercise. Check A1C  Vitamin D Def - check level and continue medications.   Continue diet and meds as discussed. Further disposition pending results of labs. Over 30 minutes of exam, counseling, chart review, and critical decision making was performed  Future Appointments  Date Time Provider Person  02/11/2020  9:00 AM Unk Pinto, MD [facility] None     HPI 73 y.o. male  presents for 3 month follow up on hypertension, cholesterol, prediabetes, and vitamin D deficiency.   His blood pressure has been controlled at home, today their BP is BP: 122/76  He does not workout. He denies chest pain, shortness of breath, dizziness. BMI is Body mass index is 26.72 kg/m., he is working on diet and exercise. Wt Readings from Last 3 Encounters:  11/07/19 186 lb 3.2 oz (84.5 kg)  08/07/19 192 lb 6.4 oz (87.3 kg)  05/02/19 192 lb (87.1 kg)    He is on cholesterol medication and denies myalgias. His cholesterol is at goal. The cholesterol last visit was:   Lab Results  Component Value Date   CHOL 178 08/07/2019   HDL 55 08/07/2019   LDLCALC 104 (H) 08/07/2019   TRIG 94 08/07/2019   CHOLHDL 3.2 08/07/2019    He has been working on diet and exercise for prediabetes, and denies paresthesia of the feet, polydipsia, polyuria and visual disturbances. Last A1C in the office was:  Lab Results  Component Value Date   HGBA1C 5.3 08/07/2019   Patient is on Vitamin D supplement.   Lab Results  Component Value Date   VD25OH 61 08/07/2019       Current Medications:  Current  Outpatient Medications on File Prior to Visit  Medication Sig Dispense Refill  . aspirin 81 MG tablet Take 81 mg by mouth daily.    Marland Kitchen atenolol (TENORMIN) 25 MG tablet TAKE ONE TABLET BY MOUTH DAILY 90 tablet 3  . Cholecalciferol (VITAMIN D PO) Take 2,000 Units by mouth 2 (two) times daily.     . hydrochlorothiazide (HYDRODIURIL) 25 MG tablet Take 1 tablet Daly for BP & Fluid Retention / Ankle Swelling 90 tablet 3  . Omega-3 Fatty Acids (FISH OIL PO) Take by mouth daily.    Marland Kitchen omeprazole (PRILOSEC) 40 MG capsule Take 1 capsule every morning for Acid Reflux 90 capsule 3  . simvastatin (ZOCOR) 40 MG tablet Take 1 tablet every night for Cholesterol 90 tablet 3   No current facility-administered medications on file prior to visit.   Medical History:  Past Medical History:  Diagnosis Date  . BPH (benign prostatic hyperplasia)   . GERD (gastroesophageal reflux disease)   . Hyperlipidemia   . Hypertension   . Prediabetes   . Vitamin D deficiency    Allergies: No Known Allergies   Review of Systems:  Review of Systems  Constitutional: Negative.   HENT: Negative.   Eyes: Negative.   Respiratory: Negative.   Cardiovascular: Negative.   Gastrointestinal: Negative.   Genitourinary: Negative.   Musculoskeletal: Negative.   Skin: Negative.     Family history- Review and  unchanged Social history- Review and unchanged Physical Exam: BP 122/76   Pulse 60   Temp (!) 97.1 F (36.2 C)   Resp 16   Ht 5\' 10"  (1.778 m)   Wt 186 lb 3.2 oz (84.5 kg)   BMI 26.72 kg/m  Wt Readings from Last 3 Encounters:  11/07/19 186 lb 3.2 oz (84.5 kg)  08/07/19 192 lb 6.4 oz (87.3 kg)  05/02/19 192 lb (87.1 kg)   General Appearance: Well nourished, in no apparent distress. Eyes: PERRLA, EOMs, conjunctiva no swelling or erythema Sinuses: No Frontal/maxillary tenderness ENT/Mouth: Ext aud canals clear, TMs without erythema, bulging. No erythema, swelling, or exudate on post pharynx.  Tonsils not swollen  or erythematous. Hearing normal.  Neck: Supple, thyroid normal.  Respiratory: Respiratory effort normal, BS equal bilaterally without rales, rhonchi, wheezing or stridor.  Cardio: RRR with no MRGs. Brisk peripheral pulses without edema.  Abdomen: Soft, + BS,  Non tender, no guarding, rebound, hernias, masses. Lymphatics: Non tender without lymphadenopathy.  Musculoskeletal: Full ROM, 5/5 strength, Normal gait Skin: Warm, dry without rashes, lesions, ecchymosis.  Neuro: Cranial nerves intact. Normal muscle tone, no cerebellar symptoms. Psych: Awake and oriented X 3, normal affect, Insight and Judgment appropriate.    Vicie Mutters, PA-C 10:49 AM [facility]

## 2019-11-07 ENCOUNTER — Other Ambulatory Visit: Payer: Self-pay

## 2019-11-07 ENCOUNTER — Ambulatory Visit (INDEPENDENT_AMBULATORY_CARE_PROVIDER_SITE_OTHER): Payer: PPO | Admitting: Physician Assistant

## 2019-11-07 VITALS — BP 122/76 | HR 60 | Temp 97.1°F | Resp 16 | Ht 70.0 in | Wt 186.2 lb

## 2019-11-07 DIAGNOSIS — I10 Essential (primary) hypertension: Secondary | ICD-10-CM | POA: Diagnosis not present

## 2019-11-07 DIAGNOSIS — E559 Vitamin D deficiency, unspecified: Secondary | ICD-10-CM | POA: Diagnosis not present

## 2019-11-07 DIAGNOSIS — E782 Mixed hyperlipidemia: Secondary | ICD-10-CM

## 2019-11-07 DIAGNOSIS — Z79899 Other long term (current) drug therapy: Secondary | ICD-10-CM

## 2019-11-07 DIAGNOSIS — R7309 Other abnormal glucose: Secondary | ICD-10-CM

## 2019-11-07 NOTE — Patient Instructions (Signed)

## 2019-11-08 LAB — LIPID PANEL
Cholesterol: 163 mg/dL (ref <200)
HDL: 55 mg/dL (ref >=40)
LDL Cholesterol (Calc): 91 mg/dL (calc)
Non-HDL Cholesterol (Calc): 108 mg/dL (calc) (ref <130)
Total CHOL/HDL Ratio: 3 (calc) (ref <5.0)
Triglycerides: 80 mg/dL (ref <150)

## 2019-11-08 LAB — COMPLETE METABOLIC PANEL WITH GFR
AG Ratio: 1.8 (calc) (ref 1.0–2.5)
ALT: 19 U/L (ref 9–46)
AST: 23 U/L (ref 10–35)
Albumin: 4.5 g/dL (ref 3.6–5.1)
Alkaline phosphatase (APISO): 81 U/L (ref 35–144)
BUN: 23 mg/dL (ref 7–25)
CO2: 32 mmol/L (ref 20–32)
Calcium: 10 mg/dL (ref 8.6–10.3)
Chloride: 103 mmol/L (ref 98–110)
Creat: 1.17 mg/dL (ref 0.70–1.18)
GFR, Est African American: 70 mL/min/{1.73_m2} (ref >=60)
GFR, Est Non African American: 61 mL/min/{1.73_m2} (ref >=60)
Globulin: 2.5 g/dL (calc) (ref 1.9–3.7)
Glucose, Bld: 86 mg/dL (ref 65–99)
Potassium: 4.3 mmol/L (ref 3.5–5.3)
Sodium: 142 mmol/L (ref 135–146)
Total Bilirubin: 0.7 mg/dL (ref 0.2–1.2)
Total Protein: 7 g/dL (ref 6.1–8.1)

## 2019-11-08 LAB — CBC WITH DIFFERENTIAL/PLATELET
Absolute Monocytes: 722 cells/uL (ref 200–950)
Basophils Absolute: 78 cells/uL (ref 0–200)
Basophils Relative: 1.2 %
Eosinophils Absolute: 202 cells/uL (ref 15–500)
Eosinophils Relative: 3.1 %
HCT: 44.2 % (ref 38.5–50.0)
Hemoglobin: 14.9 g/dL (ref 13.2–17.1)
Lymphs Abs: 1606 cells/uL (ref 850–3900)
MCH: 30.3 pg (ref 27.0–33.0)
MCHC: 33.7 g/dL (ref 32.0–36.0)
MCV: 90 fL (ref 80.0–100.0)
MPV: 10.3 fL (ref 7.5–12.5)
Monocytes Relative: 11.1 %
Neutro Abs: 3894 cells/uL (ref 1500–7800)
Neutrophils Relative %: 59.9 %
Platelets: 218 10*3/uL (ref 140–400)
RBC: 4.91 10*6/uL (ref 4.20–5.80)
RDW: 12.6 % (ref 11.0–15.0)
Total Lymphocyte: 24.7 %
WBC: 6.5 10*3/uL (ref 3.8–10.8)

## 2019-11-08 LAB — TSH: TSH: 2.73 mIU/L (ref 0.40–4.50)

## 2019-11-12 ENCOUNTER — Encounter: Payer: Self-pay | Admitting: *Deleted

## 2019-12-28 ENCOUNTER — Other Ambulatory Visit: Payer: Self-pay | Admitting: Internal Medicine

## 2020-02-08 DIAGNOSIS — D692 Other nonthrombocytopenic purpura: Secondary | ICD-10-CM | POA: Diagnosis not present

## 2020-02-08 DIAGNOSIS — D229 Melanocytic nevi, unspecified: Secondary | ICD-10-CM | POA: Diagnosis not present

## 2020-02-08 DIAGNOSIS — D225 Melanocytic nevi of trunk: Secondary | ICD-10-CM | POA: Diagnosis not present

## 2020-02-10 ENCOUNTER — Encounter: Payer: Self-pay | Admitting: Internal Medicine

## 2020-02-10 NOTE — Progress Notes (Addendum)
Annual  Screening/Preventative Visit  & Comprehensive Evaluation & Examination     This very nice 74 y.o. MWM presents for a Screening / Preventative Visit & comprehensive evaluation and management of multiple medical co-morbidities.  Patient has been followed for HTN, HLD, Prediabetes and Vitamin D Deficiency. Patient's GERD is controlled on his meds.      HTN predates circa 1996. Patient's BP has been controlled at home.  Today's BP is at goal - 118/88. Patient denies any cardiac symptoms as chest pain, palpitations, shortness of breath, dizziness or ankle swelling.     Patient's hyperlipidemia is controlled with diet and Simvastatin. Patient denies myalgias or other medication SE's. Last lipids were at goal:  Lab Results  Component Value Date   CHOL 163 11/07/2019   HDL 55 11/07/2019   LDLCALC 91 11/07/2019   TRIG 80 11/07/2019   CHOLHDL 3.0 11/07/2019       Patient has hx/o prediabetes (A1c 5.9% / 2010)  and patient denies reactive hypoglycemic symptoms, visual blurring, diabetic polys or paresthesias. Last A1c was Normal & at goal:  Lab Results  Component Value Date   HGBA1C 5.3 08/07/2019        Finally, patient has history of Vitamin D Deficiency ("41" / 2008)  and last Vitamin D was at goal:  Lab Results  Component Value Date   VD25OH 61 08/07/2019    Current Outpatient Medications on File Prior to Visit  Medication Sig  . Ascorbic Acid (VITAMIN C PO) Take 1 tablet by mouth daily.  Marland Kitchen zinc gluconate 50 MG tablet Take 50 mg by mouth daily.  Marland Kitchen aspirin 81 MG tablet Take 81 mg by mouth daily.  Marland Kitchen atenolol (TENORMIN) 25 MG tablet Takes 1 tablet daily for BP  . Cholecalciferol (VITAMIN D PO) Take 2,000 Units by mouth 2 (two) times daily.   . hydrochlorothiazide (HYDRODIURIL) 25 MG tablet Take 1 tablet Daly for BP & Fluid Retention / Ankle Swelling  . Omega-3 Fatty Acids (FISH OIL PO) Take by mouth daily.  Marland Kitchen omeprazole (PRILOSEC) 40 MG capsule Take 1 capsule every morning  for Acid Reflux  . simvastatin (ZOCOR) 40 MG tablet Take 1 tablet every night for Cholesterol   No current facility-administered medications on file prior to visit.   No Known Allergies   Past Medical History:  Diagnosis Date  . BPH (benign prostatic hyperplasia)   . GERD (gastroesophageal reflux disease)   . Hyperlipidemia   . Hypertension   . Prediabetes   . Vitamin D deficiency    Health Maintenance  Topic Date Due  . Hepatitis C Screening  05/01/2020 (Originally February 13, 1946)  . INFLUENZA VACCINE  06/08/2020  . COLONOSCOPY  10/11/2023  . TETANUS/TDAP  04/21/2028  . PNA vac Low Risk Adult  Completed   Immunization History  Administered Date(s) Administered  . Influenza, High Dose Seasonal PF 08/21/2014, 09/09/2015, 07/06/2016  . Influenza-Unspecified 08/08/2018, 08/16/2019  . Pneumococcal Conjugate-13 01/30/2019  . Pneumococcal Polysaccharide-23 06/05/2009  . Td 01/26/2007, 04/21/2018   Last Colon - 10/10/2018 - Dr Romilda Garret - recc 5 yr f/u - due Dec 2024  Past Surgical History:  Procedure Laterality Date  . CEREBRAL ANEURYSM REPAIR  1966   Family History  Problem Relation Age of Onset  . Alzheimer's disease Mother   . Cancer Father        colon  . Diabetes Sister    Social History   Socioeconomic History  . Marital status: Married    Spouse name: Izora Gala  .  Number of children: 1 son & 1 daughter & 48 [place]  Occupational History  . 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 Smoking status: Former Smoker    Quit date: 04/30/1974    Years since quitting: 45.8  . Smokeless tobacco: Never Used  Substance and Sexual Activity  . Alcohol use: No  . Drug use: No  . Sexual activity: Not on file   ROS Constitutional: Denies fever, chills, weight loss/gain, headaches, insomnia,  night sweats or change in appetite. Does c/o fatigue. Eyes: Denies redness, blurred vision, diplopia, discharge, itchy or watery eyes.  ENT: Denies discharge,  congestion, post nasal drip, epistaxis, sore throat, earache, hearing loss, dental pain, Tinnitus, Vertigo, Sinus pain or snoring.  Cardio: Denies chest pain, palpitations, irregular heartbeat, syncope, dyspnea, diaphoresis, orthopnea, PND, claudication or edema Respiratory: denies cough, dyspnea, DOE, pleurisy, hoarseness, laryngitis or wheezing.  Gastrointestinal: Denies dysphagia, heartburn, reflux, water brash, pain, cramps, nausea, vomiting, bloating, diarrhea, constipation, hematemesis, melena, hematochezia, jaundice or hemorrhoids Genitourinary: Denies dysuria, frequency, discharge, hematuria or flank pain. Has urgency, nocturia x 2-3 & occasional hesitancy. Musculoskeletal: Denies arthralgia, myalgia, stiffness, Jt. Swelling, pain, limp or strain/sprain. Denies Falls. Skin: Denies puritis, rash, hives, warts, acne, eczema or change in skin lesion Neuro: No weakness, tremor, incoordination, spasms, paresthesia or pain Psychiatric: Denies confusion, memory loss or sensory loss. Denies Depression. Endocrine: Denies change in weight, skin, hair change, nocturia, and paresthesia, diabetic polys, visual blurring or hyper / hypo glycemic episodes.  Heme/Lymph: No excessive bleeding, bruising or enlarged lymph nodes.  Physical Exam  BP 118/88   Pulse 80   Temp (!) 96.3 F (35.7 C)   Resp 16   Ht 5\' 11"  (1.803 m)   Wt 188 lb 9.6 oz (85.5 kg)   BMI 26.30 kg/m   General Appearance: Well nourished and well groomed and in no apparent distress.  Eyes: PERRLA, EOMs, conjunctiva no swelling or erythema, normal fundi and vessels. Sinuses: No frontal/maxillary tenderness ENT/Mouth: EACs patent / TMs  nl. Nares clear without erythema, swelling, mucoid exudates. Oral hygiene is good. No erythema, swelling, or exudate. Tongue normal, non-obstructing. Tonsils not swollen or erythematous. Hearing normal.  Neck: Supple, thyroid not palpable. No bruits, nodes or JVD. Respiratory: Respiratory effort  normal.  BS equal and clear bilateral without rales, rhonci, wheezing or stridor. Cardio: Heart sounds are normal with regular rate and rhythm and no murmurs, rubs or gallops. Peripheral pulses are normal and equal bilaterally without edema. No aortic or femoral bruits. Chest: symmetric with normal excursions and percussion.  Abdomen: Soft, with Nl bowel sounds. Nontender, no guarding, rebound, hernias, masses, or organomegaly.  Lymphatics: Non tender without lymphadenopathy.  Musculoskeletal: Full ROM all peripheral extremities, joint stability, 5/5 strength, and normal gait. Skin: Warm and dry without rashes, lesions, cyanosis, clubbing or  ecchymosis.  Neuro: Cranial nerves intact, reflexes equal bilaterally. Normal muscle tone, no cerebellar symptoms. Sensation intact.  Pysch: Alert and oriented x 3 with normal affect, insight and judgment appropriate.   Assessment and Plan  1. Annual Preventative/Screening Exam   2. Essential hypertension  - EKG 12-Lead - [place], RETROPERITNL ABD,  LTD - Microalbumin / creatinine urine ratio - CBC with Differential/Platelet - COMPLETE METABOLIC PANEL WITH GFR - Magnesium - TSH - Urinalysis, Routine w reflex microscopic  3. Hyperlipidemia, mixed  - EKG 12-Lead - [place], RETROPERITNL ABD,  LTD - Lipid panel - TSH  4. Abnormal glucose  - EKG 12-Lead -  [place], RETROPERITNL ABD,  LTD - Hemoglobin A1c - Insulin, random  5. Vitamin D deficiency  - VITAMIN D 25 Hydroxy   6. Prediabetes  - EKG 12-Lead - [place], RETROPERITNL ABD,  LTD - Hemoglobin A1c - Insulin, random  7. BPH with obstruction/lower urinary tract symptoms  - PSA  8. Screening for prostate cancer  - PSA  9. Screening for colorectal cancer  - POC Hemoccult Bld/Stl  10. Screening for ischemic heart disease  - EKG 12-Lead  11. Former smoker  - EKG 12-Lead - [place], RETROPERITNL ABD,  LTD  12. Screening for AAA (aortic abdominal aneurysm)  - [place], RETROPERITNL ABD,   LTD  13. Medication management  - Microalbumin / creatinine urine ratio - CBC with Differential/Platelet - COMPLETE METABOLIC PANEL WITH GFR - Magnesium - Lipid panel - TSH - Hemoglobin A1c - Insulin, random - VITAMIN D 25 Hydroxy  - Urinalysis, Routine w reflex microscopic           Patient was counseled in prudent diet, weight control to achieve/maintain BMI less than 25, BP monitoring, regular exercise and medications as discussed.  Discussed med effects and SE's. Routine screening labs and tests as requested with regular follow-up as recommended. Over 40 minutes of exam, counseling, chart review and high complex critical decision making was performed   Kirtland Bouchard, MD

## 2020-02-10 NOTE — Patient Instructions (Signed)

## 2020-02-11 ENCOUNTER — Ambulatory Visit (INDEPENDENT_AMBULATORY_CARE_PROVIDER_SITE_OTHER): Payer: PPO | Admitting: Internal Medicine

## 2020-02-11 ENCOUNTER — Other Ambulatory Visit: Payer: Self-pay

## 2020-02-11 VITALS — BP 118/88 | HR 80 | Temp 96.3°F | Resp 16 | Ht 71.0 in | Wt 188.6 lb

## 2020-02-11 DIAGNOSIS — Z125 Encounter for screening for malignant neoplasm of prostate: Secondary | ICD-10-CM

## 2020-02-11 DIAGNOSIS — Z0000 Encounter for general adult medical examination without abnormal findings: Secondary | ICD-10-CM

## 2020-02-11 DIAGNOSIS — R7309 Other abnormal glucose: Secondary | ICD-10-CM

## 2020-02-11 DIAGNOSIS — E559 Vitamin D deficiency, unspecified: Secondary | ICD-10-CM | POA: Diagnosis not present

## 2020-02-11 DIAGNOSIS — Z136 Encounter for screening for cardiovascular disorders: Secondary | ICD-10-CM | POA: Diagnosis not present

## 2020-02-11 DIAGNOSIS — Z0001 Encounter for general adult medical examination with abnormal findings: Secondary | ICD-10-CM

## 2020-02-11 DIAGNOSIS — Z79899 Other long term (current) drug therapy: Secondary | ICD-10-CM

## 2020-02-11 DIAGNOSIS — R7303 Prediabetes: Secondary | ICD-10-CM | POA: Diagnosis not present

## 2020-02-11 DIAGNOSIS — E782 Mixed hyperlipidemia: Secondary | ICD-10-CM

## 2020-02-11 DIAGNOSIS — I10 Essential (primary) hypertension: Secondary | ICD-10-CM

## 2020-02-11 DIAGNOSIS — Z1211 Encounter for screening for malignant neoplasm of colon: Secondary | ICD-10-CM

## 2020-02-11 DIAGNOSIS — Z87891 Personal history of nicotine dependence: Secondary | ICD-10-CM

## 2020-02-11 DIAGNOSIS — N138 Other obstructive and reflux uropathy: Secondary | ICD-10-CM

## 2020-02-11 DIAGNOSIS — Z1212 Encounter for screening for malignant neoplasm of rectum: Secondary | ICD-10-CM

## 2020-02-11 DIAGNOSIS — N401 Enlarged prostate with lower urinary tract symptoms: Secondary | ICD-10-CM | POA: Diagnosis not present

## 2020-02-12 LAB — MICROALBUMIN / CREATININE URINE RATIO
Creatinine, Urine: 164 mg/dL (ref 20–320)
Microalb Creat Ratio: 1 mcg/mg creat (ref <30)
Microalb, Ur: 0.2 mg/dL

## 2020-02-12 LAB — URINALYSIS, ROUTINE W REFLEX MICROSCOPIC
Bilirubin Urine: NEGATIVE
Glucose, UA: NEGATIVE
Hgb urine dipstick: NEGATIVE
Leukocytes,Ua: NEGATIVE
Nitrite: NEGATIVE
Protein, ur: NEGATIVE
Specific Gravity, Urine: 1.023 (ref 1.001–1.03)
pH: <=5 (ref 5.0–8.0)

## 2020-02-12 LAB — CBC WITH DIFFERENTIAL/PLATELET
Absolute Monocytes: 530 cells/uL (ref 200–950)
Basophils Absolute: 60 cells/uL (ref 0–200)
Basophils Relative: 1.2 %
Eosinophils Absolute: 190 cells/uL (ref 15–500)
Eosinophils Relative: 3.8 %
HCT: 43.2 % (ref 38.5–50.0)
Hemoglobin: 14.7 g/dL (ref 13.2–17.1)
Lymphs Abs: 1415 cells/uL (ref 850–3900)
MCH: 30.8 pg (ref 27.0–33.0)
MCHC: 34 g/dL (ref 32.0–36.0)
MCV: 90.6 fL (ref 80.0–100.0)
MPV: 9.9 fL (ref 7.5–12.5)
Monocytes Relative: 10.6 %
Neutro Abs: 2805 cells/uL (ref 1500–7800)
Neutrophils Relative %: 56.1 %
Platelets: 205 10*3/uL (ref 140–400)
RBC: 4.77 10*6/uL (ref 4.20–5.80)
RDW: 13.4 % (ref 11.0–15.0)
Total Lymphocyte: 28.3 %
WBC: 5 10*3/uL (ref 3.8–10.8)

## 2020-02-12 LAB — COMPLETE METABOLIC PANEL WITH GFR
AG Ratio: 2.2 (calc) (ref 1.0–2.5)
ALT: 16 U/L (ref 9–46)
AST: 21 U/L (ref 10–35)
Albumin: 4.3 g/dL (ref 3.6–5.1)
Alkaline phosphatase (APISO): 86 U/L (ref 35–144)
BUN: 24 mg/dL (ref 7–25)
CO2: 30 mmol/L (ref 20–32)
Calcium: 9.4 mg/dL (ref 8.6–10.3)
Chloride: 104 mmol/L (ref 98–110)
Creat: 1.08 mg/dL (ref 0.70–1.18)
GFR, Est African American: 77 mL/min/{1.73_m2} (ref >=60)
GFR, Est Non African American: 67 mL/min/{1.73_m2} (ref >=60)
Globulin: 2 g/dL (calc) (ref 1.9–3.7)
Glucose, Bld: 85 mg/dL (ref 65–99)
Potassium: 3.9 mmol/L (ref 3.5–5.3)
Sodium: 142 mmol/L (ref 135–146)
Total Bilirubin: 0.8 mg/dL (ref 0.2–1.2)
Total Protein: 6.3 g/dL (ref 6.1–8.1)

## 2020-02-12 LAB — LIPID PANEL
Cholesterol: 172 mg/dL (ref <200)
HDL: 49 mg/dL (ref >=40)
LDL Cholesterol (Calc): 105 mg/dL (calc) — ABNORMAL HIGH
Non-HDL Cholesterol (Calc): 123 mg/dL (calc) (ref <130)
Total CHOL/HDL Ratio: 3.5 (calc) (ref <5.0)
Triglycerides: 85 mg/dL (ref <150)

## 2020-02-12 LAB — TSH: TSH: 2.16 mIU/L (ref 0.40–4.50)

## 2020-02-12 LAB — INSULIN, RANDOM: Insulin: 7.6 u[IU]/mL

## 2020-02-12 LAB — VITAMIN D 25 HYDROXY (VIT D DEFICIENCY, FRACTURES): Vit D, 25-Hydroxy: 69 ng/mL (ref 30–100)

## 2020-02-12 LAB — HEMOGLOBIN A1C
Hgb A1c MFr Bld: 5.3 % of total Hgb (ref <5.7)
Mean Plasma Glucose: 105 (calc)
eAG (mmol/L): 5.8 (calc)

## 2020-02-12 LAB — PSA: PSA: 0.9 ng/mL (ref <=4.0)

## 2020-02-12 LAB — MAGNESIUM: Magnesium: 2.1 mg/dL (ref 1.5–2.5)

## 2020-02-28 ENCOUNTER — Other Ambulatory Visit: Payer: Self-pay | Admitting: *Deleted

## 2020-02-28 DIAGNOSIS — Z1212 Encounter for screening for malignant neoplasm of rectum: Secondary | ICD-10-CM | POA: Diagnosis not present

## 2020-02-28 DIAGNOSIS — Z1211 Encounter for screening for malignant neoplasm of colon: Secondary | ICD-10-CM

## 2020-02-28 LAB — POC HEMOCCULT BLD/STL (HOME/3-CARD/SCREEN)
Card #2 Fecal Occult Blod, POC: NEGATIVE
Card #3 Fecal Occult Blood, POC: NEGATIVE
Fecal Occult Blood, POC: NEGATIVE

## 2020-03-11 DIAGNOSIS — H02834 Dermatochalasis of left upper eyelid: Secondary | ICD-10-CM | POA: Diagnosis not present

## 2020-03-11 DIAGNOSIS — H02831 Dermatochalasis of right upper eyelid: Secondary | ICD-10-CM | POA: Diagnosis not present

## 2020-03-11 DIAGNOSIS — H04123 Dry eye syndrome of bilateral lacrimal glands: Secondary | ICD-10-CM | POA: Diagnosis not present

## 2020-03-11 DIAGNOSIS — H25813 Combined forms of age-related cataract, bilateral: Secondary | ICD-10-CM | POA: Diagnosis not present

## 2020-03-31 ENCOUNTER — Other Ambulatory Visit: Payer: Self-pay

## 2020-03-31 ENCOUNTER — Ambulatory Visit (HOSPITAL_COMMUNITY)
Admission: EM | Admit: 2020-03-31 | Discharge: 2020-03-31 | Disposition: A | Discharge status: Home / Self Care | Payer: PPO | Attending: Family Medicine | Admitting: Family Medicine

## 2020-03-31 DIAGNOSIS — R059 Cough, unspecified: Secondary | ICD-10-CM

## 2020-03-31 DIAGNOSIS — U071 COVID-19: Secondary | ICD-10-CM | POA: Diagnosis not present

## 2020-03-31 DIAGNOSIS — R05 Cough: Secondary | ICD-10-CM | POA: Insufficient documentation

## 2020-03-31 DIAGNOSIS — Z1152 Encounter for screening for COVID-19: Secondary | ICD-10-CM | POA: Diagnosis not present

## 2020-03-31 DIAGNOSIS — Z20822 Contact with and (suspected) exposure to covid-19: Secondary | ICD-10-CM | POA: Diagnosis not present

## 2020-03-31 LAB — SARS CORONAVIRUS 2 (TAT 6-24 HRS): SARS Coronavirus 2: POSITIVE — AB

## 2020-03-31 MED ORDER — BENZONATATE 100 MG PO CAPS
100.0000 mg | ORAL_CAPSULE | Freq: Three times a day (TID) | ORAL | 0 refills | Status: DC
Start: 2020-03-31 — End: 2020-08-25

## 2020-03-31 NOTE — ED Triage Notes (Signed)
Pt reports cough and weakness about a week. Pt denies fever, SOB, abdominal pain or  n/v/d.   No Tynenol or NSAIDs.

## 2020-03-31 NOTE — ED Provider Notes (Signed)
[place]   [id number] 03/31/20 Arrival Time: [id number]   CC: COVID symptoms  SUBJECTIVE: History from: patient.  Brendan Monroe is a 74 y.o. male who presents with abrupt onset of nasal congestion, PND, and persistent dry cough for one week.  Reports sick exposure to COVID. Denies recent travel. Has not attempted to treat at home. Symptoms are worse with activity.  Denies previous symptoms in the past. Denies fever, chills, fatigue, sinus pain, rhinorrhea, sore throat, SOB, wheezing, chest pain, nausea, changes in bowel or bladder habits.    ROS: As per HPI.  All other pertinent ROS negative.     Past Medical History:  Diagnosis Date  . BPH (benign prostatic hyperplasia)   . GERD (gastroesophageal reflux disease)   . Hyperlipidemia   . Hypertension   . Prediabetes   . Vitamin D deficiency    Past Surgical History:  Procedure Laterality Date  . CEREBRAL ANEURYSM REPAIR  1996   No Known Allergies No current facility-administered medications on file prior to encounter.   Current Outpatient Medications on File Prior to Encounter  Medication Sig Dispense Refill  . Ascorbic Acid (VITAMIN C PO) Take 1 tablet by mouth daily.    Marland Kitchen aspirin 81 MG tablet Take 81 mg by mouth daily.    Marland Kitchen atenolol (TENORMIN) 25 MG tablet Takes 1 tablet daily for BP 90 tablet 2  . Cholecalciferol (VITAMIN D PO) Take 2,000 Units by mouth 2 (two) times daily.     . hydrochlorothiazide (HYDRODIURIL) 25 MG tablet Take 1 tablet Daly for BP & Fluid Retention / Ankle Swelling 90 tablet 3  . omeprazole (PRILOSEC) 40 MG capsule Take 1 capsule every morning for Acid Reflux 90 capsule 3  . simvastatin (ZOCOR) 40 MG tablet Take 1 tablet every night for Cholesterol 90 tablet 3  . zinc gluconate 50 MG tablet Take 50 mg by mouth daily.    . Omega-3 Fatty Acids (FISH OIL PO) Take by mouth daily.     Social History   Socioeconomic History  . Marital status: Married    Spouse name: Not on file  . Number of  children: Not on file  . Years of education: Not on file  . Highest education level: Not on file  Occupational History  . Not on file  Tobacco Use  . Smoking status: Former Smoker    Quit date: 04/30/1974    Years since quitting: 45.9  . Smokeless tobacco: Never Used  Substance and Sexual Activity  . Alcohol use: No  . Drug use: No  . Sexual activity: Not on file  Other Topics Concern  . Not on file  Social History Narrative  . Not on file   Social Determinants of Health   Financial Resource Strain:   . Difficulty of Paying Living Expenses:   Food Insecurity:   . Worried About Charity fundraiser in the Last Year:   . Arboriculturist in the Last Year:   Transportation Needs:   . Film/video editor (Medical):   Marland Kitchen Lack of Transportation (Non-Medical):   Physical Activity:   . Days of Exercise per Week:   . Minutes of Exercise per Session:   Stress:   . Feeling of Stress :   Social Connections:   . Frequency of Communication with Friends and Family:   . Frequency of Social Gatherings with Friends and Family:   . Attends Religious Services:   . Active Member of Clubs or Organizations:   .  Attends Archivist Meetings:   Marland Kitchen Marital Status:   Intimate Partner Violence:   . Fear of Current or Ex-Partner:   . Emotionally Abused:   Marland Kitchen Physically Abused:   . Sexually Abused:    Family History  Problem Relation Age of Onset  . Alzheimer's disease Mother   . Cancer Father        colon  . Diabetes Sister     OBJECTIVE:  Vitals:   03/31/20 0901  BP: (!) 144/76  Pulse: 72  Resp: 18  Temp: 98.7 F (37.1 C)  TempSrc: Oral  SpO2: 100%     General appearance: alert; appears fatigued, but nontoxic; speaking in full sentences and tolerating own secretions HEENT: NCAT; Ears: EACs clear, TMs pearly gray; Eyes: PERRL.  EOM grossly intact. Sinuses: nontender; Nose: nares patent without rhinorrhea, Throat: oropharynx clear, tonsils non erythematous or enlarged,  uvula midline  Neck: supple without LAD Lungs: unlabored respirations, symmetrical air entry; cough: mild; no respiratory distress; CTAB Heart: regular rate and rhythm.  Radial pulses 2+ symmetrical bilaterally Skin: warm and dry Psychological: alert and cooperative; normal mood and affect  LABS:  No results found for this or any previous visit (from the past 24 hour(s)).   ASSESSMENT & PLAN:  1. Cough   2. Contact with and (suspected) exposure to covid-19   3. Encounter for screening for COVID-19     Meds ordered this encounter  Medications  . benzonatate (TESSALON) 100 MG capsule    Sig: Take 1 capsule (100 mg total) by mouth every 8 (eight) hours.    Dispense:  21 capsule    Refill:  0    Order Specific Question:   Supervising Provider    Answer:   Chase Picket [id number]      COVID testing ordered.  It will take between 1-2 days for test results.  Someone will contact you regarding abnormal results.    Patient should remain in quarantine until they have received Covid results.  If negative you may resume normal activities (go back to work/school) while practicing hand hygiene, social distance, and mask wearing.  If positive, patient should remain in quarantine for 10 days from symptom onset AND greater than 72 hours after symptoms resolution (absence of fever without the use of fever-reducing medication and improvement in respiratory symptoms), whichever is longer Get plenty of rest and push fluids Prescribed tessalon perles for cough Use OTC zyrtec for nasal congestion, runny nose, and/or sore throat Use OTC flonase for nasal congestion and runny nose Use medications daily for symptom relief Use OTC medications like ibuprofen or tylenol as needed fever or pain Call or go to the ED if you have any new or worsening symptoms such as fever, worsening cough, shortness of breath, chest tightness, chest pain, turning blue, changes in mental status.  Reviewed expectations re:  course of current medical issues. Questions answered. Outlined signs and symptoms indicating need for more acute intervention. Patient verbalized understanding. After Visit Summary given.         Faustino Congress, NP 03/31/20 [id number]

## 2020-03-31 NOTE — Discharge Instructions (Signed)
Your COVID test is pending.  You should self quarantine until the test result is back.    Take Tylenol as needed for fever or discomfort.  Rest and keep yourself hydrated.    Go to the emergency department if you develop shortness of breath, severe diarrhea, high fever not relieved by Tylenol or ibuprofen, or other concerning symptoms.    

## 2020-04-01 ENCOUNTER — Ambulatory Visit (HOSPITAL_COMMUNITY)
Admission: RE | Admit: 2020-04-01 | Discharge: 2020-04-01 | Disposition: A | Discharge status: Home / Self Care | Payer: Medicare Other | Source: Ambulatory Visit | Attending: Pulmonary Disease | Admitting: Pulmonary Disease

## 2020-04-01 ENCOUNTER — Other Ambulatory Visit: Payer: Self-pay | Admitting: Nurse Practitioner

## 2020-04-01 DIAGNOSIS — U071 COVID-19: Secondary | ICD-10-CM

## 2020-04-01 DIAGNOSIS — Z23 Encounter for immunization: Secondary | ICD-10-CM | POA: Diagnosis present

## 2020-04-01 DIAGNOSIS — I10 Essential (primary) hypertension: Secondary | ICD-10-CM | POA: Diagnosis not present

## 2020-04-01 MED ORDER — METHYLPREDNISOLONE SODIUM SUCC 125 MG IJ SOLR: 125.0000 mg | Freq: Once | INTRAMUSCULAR | Status: DC | PRN

## 2020-04-01 MED ORDER — DIPHENHYDRAMINE HCL 50 MG/ML IJ SOLN: 50.0000 mg | Freq: Once | INTRAMUSCULAR | Status: DC | PRN

## 2020-04-01 MED ORDER — FAMOTIDINE IN NACL 20-0.9 MG/50ML-% IV SOLN: 20.0000 mg | Freq: Once | INTRAVENOUS | Status: DC | PRN

## 2020-04-01 MED ORDER — EPINEPHRINE 0.3 MG/0.3ML IJ SOAJ: 0.3000 mg | Freq: Once | INTRAMUSCULAR | Status: DC | PRN

## 2020-04-01 MED ORDER — SODIUM CHLORIDE 0.9 % IV SOLN
Freq: Once | INTRAVENOUS | Status: AC
  Filled 2020-04-01: qty 700

## 2020-04-01 MED ORDER — ALBUTEROL SULFATE HFA 108 (90 BASE) MCG/ACT IN AERS: 2.0000 | INHALATION_SPRAY | Freq: Once | RESPIRATORY_TRACT | Status: DC | PRN

## 2020-04-01 MED ORDER — SODIUM CHLORIDE 0.9 % IV SOLN: INTRAVENOUS | Status: DC | PRN

## 2020-04-01 NOTE — Progress Notes (Signed)
  I connected by phone with Brendan Monroe on 04/01/2020 at 9:25 AM to discuss the potential use of an new treatment for mild to moderate COVID-19 viral infection in non-hospitalized patients.  This patient is a 74 y.o. male that meets the FDA criteria for Emergency Use Authorization of bamlanivimab/etesevimab or casirivimab/imdevimab.  Has a (+) direct SARS-CoV-2 viral test result  Has mild or moderate COVID-19   Is ? 74 years of age and weighs ? 40 kg  Is NOT hospitalized due to COVID-19  Is NOT requiring oxygen therapy or requiring an increase in baseline oxygen flow rate due to COVID-19  Is within 10 days of symptom onset  Has at least one of the high risk factor(s) for progression to severe COVID-19 and/or hospitalization as defined in EUA.  Specific high risk criteria : >/= 76 yo   I have spoken and communicated the following to the patient or parent/caregiver:  1. FDA has authorized the emergency use of bamlanivimab/etesevimab and casirivimab\imdevimab for the treatment of mild to moderate COVID-19 in adults and pediatric patients with positive results of direct SARS-CoV-2 viral testing who are 65 years of age and older weighing at least 40 kg, and who are at high risk for progressing to severe COVID-19 and/or hospitalization.  2. The significant known and potential risks and benefits of bamlanivimab/etesevimab and casirivimab\imdevimab, and the extent to which such potential risks and benefits are unknown.  3. Information on available alternative treatments and the risks and benefits of those alternatives, including clinical trials.  4. Patients treated with bamlanivimab/etesevimab and casirivimab\imdevimab should continue to self-isolate and use infection control measures (e.g., wear mask, isolate, social distance, avoid sharing personal items, clean and disinfect "high touch" surfaces, and frequent handwashing) according to CDC guidelines.   5. The patient or parent/caregiver  has the option to accept or refuse bamlanivimab/etesevimab or casirivimab\imdevimab .  After reviewing this information with the patient, The patient agreed to proceed with receiving the bamlanimivab infusion and will be provided a copy of the Fact sheet prior to receiving the infusion.Fenton Foy 04/01/2020 9:25 AM

## 2020-04-01 NOTE — Progress Notes (Signed)
  Diagnosis: COVID-19  Physician: Dr. Joya Gaskins  Procedure: [facility] Med: bamlanivimab\etesevimab infusion - Provided patient with bamlanimivab\etesevimab fact sheet for patients, parents and caregivers prior to infusion.  Complications: No immediate complications noted.  Discharge: Discharged home   Brendan Monroe 04/01/2020

## 2020-04-01 NOTE — Discharge Instructions (Signed)

## 2020-04-02 ENCOUNTER — Telehealth: Payer: Self-pay | Admitting: Adult Health

## 2020-04-02 NOTE — Telephone Encounter (Signed)
Called patient daughter Brendan Monroe.  She had left a message on our voice mail about her father, Brendan Monroe, as he received an infusion yesterday and vomited this morning.  She wanted to know if there was anything else she should be worried about or looking out for.  Since he vomited earlier today, he has been feeling improved and isn't nauseated.  I let her know that this can happen with the infusion, and that I would happily send in an anti nausea medication for him to have on hand, however patients will typically feel better two days after the infusion.  She verbalized understanding and declined the prescription at this point as he is feeling much improved.  She will let us know if he develops any further symptoms, or recurrent symptoms.  Wilber Bihari, NP

## 2020-05-15 ENCOUNTER — Encounter: Payer: Self-pay | Admitting: Adult Health Nurse Practitioner

## 2020-05-15 ENCOUNTER — Other Ambulatory Visit: Payer: Self-pay

## 2020-05-15 ENCOUNTER — Ambulatory Visit: Payer: PPO | Admitting: Adult Health

## 2020-05-15 ENCOUNTER — Ambulatory Visit (INDEPENDENT_AMBULATORY_CARE_PROVIDER_SITE_OTHER): Payer: PPO | Admitting: Adult Health Nurse Practitioner

## 2020-05-15 VITALS — BP 110/70 | HR 52 | Temp 97.0°F | Resp 16 | Ht 71.0 in | Wt 187.2 lb

## 2020-05-15 DIAGNOSIS — R7309 Other abnormal glucose: Secondary | ICD-10-CM | POA: Diagnosis not present

## 2020-05-15 DIAGNOSIS — E559 Vitamin D deficiency, unspecified: Secondary | ICD-10-CM | POA: Diagnosis not present

## 2020-05-15 DIAGNOSIS — E782 Mixed hyperlipidemia: Secondary | ICD-10-CM

## 2020-05-15 DIAGNOSIS — I10 Essential (primary) hypertension: Secondary | ICD-10-CM | POA: Diagnosis not present

## 2020-05-15 LAB — COMPLETE METABOLIC PANEL WITH GFR
AG Ratio: 1.8 (calc) (ref 1.0–2.5)
ALT: 18 U/L (ref 9–46)
AST: 22 U/L (ref 10–35)
Albumin: 4.1 g/dL (ref 3.6–5.1)
Alkaline phosphatase (APISO): 81 U/L (ref 35–144)
BUN: 18 mg/dL (ref 7–25)
CO2: 32 mmol/L (ref 20–32)
Calcium: 9.3 mg/dL (ref 8.6–10.3)
Chloride: 101 mmol/L (ref 98–110)
Creat: 1.12 mg/dL (ref 0.70–1.18)
GFR, Est African American: 74 mL/min/{1.73_m2} (ref >=60)
GFR, Est Non African American: 63 mL/min/{1.73_m2} (ref >=60)
Globulin: 2.3 g/dL (calc) (ref 1.9–3.7)
Glucose, Bld: 76 mg/dL (ref 65–99)
Potassium: 4.2 mmol/L (ref 3.5–5.3)
Sodium: 141 mmol/L (ref 135–146)
Total Bilirubin: 0.8 mg/dL (ref 0.2–1.2)
Total Protein: 6.4 g/dL (ref 6.1–8.1)

## 2020-05-15 LAB — CBC WITH DIFFERENTIAL/PLATELET
Absolute Monocytes: 634 cells/uL (ref 200–950)
Basophils Absolute: 61 cells/uL (ref 0–200)
Basophils Relative: 1 %
Eosinophils Absolute: 201 cells/uL (ref 15–500)
Eosinophils Relative: 3.3 %
HCT: 40.8 % (ref 38.5–50.0)
Hemoglobin: 14 g/dL (ref 13.2–17.1)
Lymphs Abs: 1720 cells/uL (ref 850–3900)
MCH: 31.3 pg (ref 27.0–33.0)
MCHC: 34.3 g/dL (ref 32.0–36.0)
MCV: 91.3 fL (ref 80.0–100.0)
MPV: 10.3 fL (ref 7.5–12.5)
Monocytes Relative: 10.4 %
Neutro Abs: 3483 cells/uL (ref 1500–7800)
Neutrophils Relative %: 57.1 %
Platelets: 207 10*3/uL (ref 140–400)
RBC: 4.47 10*6/uL (ref 4.20–5.80)
RDW: 13.3 % (ref 11.0–15.0)
Total Lymphocyte: 28.2 %
WBC: 6.1 10*3/uL (ref 3.8–10.8)

## 2020-05-15 LAB — LIPID PANEL
Cholesterol: 153 mg/dL (ref <200)
HDL: 53 mg/dL (ref >=40)
LDL Cholesterol (Calc): 82 mg/dL (calc)
Non-HDL Cholesterol (Calc): 100 mg/dL (calc) (ref <130)
Total CHOL/HDL Ratio: 2.9 (calc) (ref <5.0)
Triglycerides: 88 mg/dL (ref <150)

## 2020-05-15 NOTE — Progress Notes (Signed)
FOLLOW UP THREE MONTH  Assessment and Plan:     Hypertension Continue current medications:Atenolol 25mg , half tablet, HCTZ 25mg  half tablet. Monitor blood pressure at home; call if consistently over 130/80 Continue DASH diet.   Reminder to go to the ER if any CP, SOB, nausea, dizziness, severe HA, changes vision/speech, left arm numbness and tingling and jaw pain.  Cholesterol -Continue medications: simvastatin 40mg , half tablet Discussed dietary and exercise modifications Low fat diet   Prediabetes  Discussed dietary and exercise modifications  Vitamin D Def Continue supplementation to maintain goal of 60-100 Taking Vitamin D 2,000 IU two tablets daily   Continue diet and meds as discussed. Further disposition pending results of labs. Over 30 minutes of face to face exam, counseling, chart review, and critical decision making was performed  Future Appointments  Date Time Provider [place]  08/25/2020  9:30 AM Vicie Mutters, PA-C [facility] None  02/16/2021  9:00 AM Unk Pinto, MD [facility] None     HPI 74 y.o. male  presents for 3 month follow up on HTN, HLD, prediabetes, and vitamin D deficiency.  He does walk, 30-1hour   His blood pressure has been controlled at home, today their BP is BP: 110/70  He does not workout. He denies chest pain, shortness of breath, dizziness. BMI is Body mass index is 26.11 kg/m., he is working on diet and exercise. Wt Readings from Last 3 Encounters:  05/15/20 187 lb 3.2 oz (84.9 kg)  02/11/20 188 lb 9.6 oz (85.5 kg)  11/07/19 186 lb 3.2 oz (84.5 kg)    He is on cholesterol medication and denies myalgias. His cholesterol is at goal. The cholesterol last visit was:   Lab Results  Component Value Date   CHOL 153 05/15/2020   HDL 53 05/15/2020   LDLCALC 82 05/15/2020   TRIG 88 05/15/2020   CHOLHDL 2.9 05/15/2020    He has been working on diet and exercise for prediabetes (A1c 5.9%, 2010), and denies paresthesia of  the feet, polydipsia, polyuria and visual disturbances. Last A1C in the office was:  Lab Results  Component Value Date   HGBA1C 5.3 02/11/2020   Patient is on Vitamin D supplement for deficency (41 / 2008) Lab Results  Component Value Date   VD25OH 69 02/11/2020       Current Medications:  Current Outpatient Medications on File Prior to Visit  Medication Sig Dispense Refill  . Ascorbic Acid (VITAMIN C PO) Take 1 tablet by mouth daily.    Marland Kitchen aspirin 81 MG tablet Take 81 mg by mouth daily.    Marland Kitchen atenolol (TENORMIN) 25 MG tablet Takes 1 tablet daily for BP 90 tablet 2  . benzonatate (TESSALON) 100 MG capsule Take 1 capsule (100 mg total) by mouth every 8 (eight) hours. 21 capsule 0  . Cholecalciferol (VITAMIN D PO) Take 2,000 Units by mouth 2 (two) times daily.     . hydrochlorothiazide (HYDRODIURIL) 25 MG tablet Take 1 tablet Daly for BP & Fluid Retention / Ankle Swelling 90 tablet 3  . Omega-3 Fatty Acids (FISH OIL PO) Take by mouth daily.    . simvastatin (ZOCOR) 40 MG tablet Take 1 tablet every night for Cholesterol 90 tablet 3  . zinc gluconate 50 MG tablet Take 50 mg by mouth daily.     No current facility-administered medications on file prior to visit.   Medical History:  Past Medical History:  Diagnosis Date  . BPH (benign prostatic hyperplasia)   . GERD (gastroesophageal  reflux disease)   . Hyperlipidemia   . Hypertension   . Prediabetes   . Vitamin D deficiency    Allergies: No Known Allergies   Review of Systems:  Review of Systems  Constitutional: Negative.   HENT: Negative.   Eyes: Negative.   Respiratory: Negative.   Cardiovascular: Negative.   Gastrointestinal: Negative.   Genitourinary: Negative.   Musculoskeletal: Negative.   Skin: Negative.     Family history- Review and unchanged Social history- Review and unchanged Physical Exam: BP 110/70   Pulse (!) 52   Temp (!) 97 F (36.1 C)   Resp 16   Ht 5\' 11"  (1.803 m)   Wt 187 lb 3.2 oz (84.9 kg)    BMI 26.11 kg/m  Wt Readings from Last 3 Encounters:  05/15/20 187 lb 3.2 oz (84.9 kg)  02/11/20 188 lb 9.6 oz (85.5 kg)  11/07/19 186 lb 3.2 oz (84.5 kg)   General Appearance: Well nourished, in no apparent distress. Eyes: PERRLA, EOMs, conjunctiva no swelling or erythema Sinuses: No Frontal/maxillary tenderness ENT/Mouth: Ext aud canals clear, TMs without erythema, bulging. No erythema, swelling, or exudate on post pharynx.  Tonsils not swollen or erythematous. Hearing normal.  Neck: Supple, thyroid normal.  Respiratory: Respiratory effort normal, BS equal bilaterally without rales, rhonchi, wheezing or stridor.  Cardio: RRR with no MRGs. Brisk peripheral pulses without edema.  Abdomen: Soft, + BS,  Non tender, no guarding, rebound, hernias, masses. Lymphatics: Non tender without lymphadenopathy.  Musculoskeletal: Full ROM, 5/5 strength, Normal gait Skin: Warm, dry without rashes, lesions, ecchymosis.  Neuro: Cranial nerves intact. Normal muscle tone, no cerebellar symptoms. Psych: Awake and oriented X 3, normal affect, Insight and Judgment appropriate.    Garnet Sierras, NP 10:51 AM [facility]

## 2020-05-16 ENCOUNTER — Ambulatory Visit: Payer: PPO | Admitting: Physician Assistant

## 2020-05-19 ENCOUNTER — Ambulatory Visit: Payer: PPO | Admitting: Adult Health

## 2020-08-21 NOTE — Progress Notes (Addendum)
MEDICARE ANNUAL WELLNESS VISIT AND FOLLOW UP Assessment:   Diagnoses and all orders for this visit:  Encounter for Medicare annual wellness exam - hep C screening never done, completed today   Essential hypertension Continue medication Monitor blood pressure at home; call if consistently over 130/80 Continue DASH diet.   Reminder to go to the ER if any CP, SOB, nausea, dizziness, severe HA, changes vision/speech, left arm numbness and tingling and jaw pain.  Gastroesophageal reflux disease, esophagitis presence not specified Well managed by lifestyle  Discussed diet, avoiding triggers and other lifestyle changes  Benign prostatic hyperplasia, unspecified whether lower urinary tract symptoms present Currently off of medications and asymptomatic  Vitamin D deficiency At goal at recent check; continue to recommend supplementation for goal of 60-100 Defer vitamin D level  Other abnormal glucose Recent A1Cs at goal Discussed diet/exercise, weight management  Defer A1C; check CMP  Mixed hyperlipidemia Continue medications Continue low cholesterol diet and exercise.  Check lipid panel.   Overweight (BMI 25.0-29.9) Long discussion about weight loss, diet, and exercise Recommended diet heavy in fruits and veggies and low in animal meats, cheeses, and dairy products, appropriate calorie intake Discussed appropriate weight for height  Start exercising - 150 min/week Follow up at next visit  History of cerebral aneurysm repair Stable since 1996; control blood pressure  Need for influenza vaccine High dose quadrivalent administered without complication today    Over 30 minutes of exam, counseling, chart review, and critical decision making was performed  Future Appointments  Date Time Provider [place]  03/11/2021  3:00 PM Unk Pinto, MD [facility] None  08/25/2021 11:15 AM Liane Comber, NP [facility] None     Plan:   During the course of the visit the  patient was educated and counseled about appropriate screening and preventive services including:    Pneumococcal vaccine   Influenza vaccine  Prevnar 13  Td vaccine  Screening electrocardiogram  Colorectal cancer screening  Diabetes screening  Glaucoma screening  Nutrition counseling    Subjective:  Brendan Monroe. is a 74 y.o. male who presents for Medicare Annual Wellness Visit and 3 month follow up for HTN, hyperlipidemia, glucose management, and vitamin D Def.   He has hx of cerebral aneurysm repair in 1996 after which he has some mild short term memory loss which has been stable.  he has a diagnosis of GERD which is currently managed by lifestyle, no problems in several years.   BMI is Body mass index is 26.92 kg/m., he has been working on diet- has cut out junk food. Needs to restart walking with wife. He does go fishing regularly at [employer] with some walking, at least 3 days a week.  Wt Readings from Last 3 Encounters:  08/25/20 193 lb (87.5 kg)  05/15/20 187 lb 3.2 oz (84.9 kg)  02/11/20 188 lb 9.6 oz (85.5 kg)   His blood pressure has been controlled at home, today their BP is BP: 126/74 He does workout. He denies chest pain, shortness of breath, dizziness.   He is on cholesterol medication (simvastatin 40 mg daily) and denies myalgias. His cholesterol is at goal. The cholesterol last visit was:   Lab Results  Component Value Date   CHOL 138 08/25/2020   HDL 53 08/25/2020   LDLCALC 70 08/25/2020   TRIG 70 08/25/2020   CHOLHDL 2.6 08/25/2020   He has been working on diet and exercise for glucose managment, and denies foot ulcerations, increased appetite, nausea, paresthesia of  the feet, polydipsia, polyuria, visual disturbances, vomiting and weight loss. Last A1C in the office was:  Lab Results  Component Value Date   HGBA1C 5.3 02/11/2020   Last GFR Lab Results  Component Value Date   GFRNONAA 66 08/25/2020    Patient is on Vitamin D  supplement.   Lab Results  Component Value Date   VD25OH 69 02/11/2020      Medication Review:   Current Outpatient Medications (Cardiovascular):  .  atenolol (TENORMIN) 25 MG tablet, Takes 1 tablet daily for BP .  hydrochlorothiazide (HYDRODIURIL) 25 MG tablet, Take 1 tablet [place] for BP & Fluid Retention / Ankle Swelling .  simvastatin (ZOCOR) 40 MG tablet, Take      1 tablet      at Bedtime      for Cholesterol   Current Outpatient Medications (Analgesics):  .  aspirin 81 MG tablet, Take 81 mg by mouth daily.   Current Outpatient Medications (Other):  Marland Kitchen  Ascorbic Acid (VITAMIN C PO), Take 1 tablet by mouth daily. .  Cholecalciferol (VITAMIN D PO), Take 2,000 Units by mouth 2 (two) times daily.  .  Omega-3 Fatty Acids (FISH OIL PO), Take by mouth daily. Marland Kitchen  zinc gluconate 50 MG tablet, Take 50 mg by mouth daily.  Allergies: No Known Allergies  Current Problems (verified) has Other abnormal glucose; Hyperlipidemia; Hypertension; GERD (gastroesophageal reflux disease); BPH (benign prostatic hyperplasia); Vitamin D deficiency; Medication management; Overweight (BMI 25.0-29.9); and History of cerebral aneurysm repair on their problem list.  Screening Tests Immunization History  Administered Date(s) Administered  . Influenza, High Dose Seasonal PF 08/21/2014, 09/09/2015, 07/06/2016, 08/25/2020  . Influenza-Unspecified 08/08/2018, 08/16/2019  . Pneumococcal Conjugate-13 01/30/2019  . Pneumococcal Polysaccharide-23 06/05/2009  . Td 01/26/2007, 04/21/2018   Preventative care: Last colonoscopy: 10/2018 - Dr. Cristina Gong due 5 years  Prior vaccinations: TD or Tdap: 2019 Influenza: 2020 DUE, TODAY  Pneumococcal: 2010 Prevnar13: 2020 Shingles/Zostavax: Declines Covid 19: declines   Names of Other Physician/Practitioners you currently use: 1. [facility] here for primary care 2. Dr. Katy Fitch , eye doctor, last visit 2021, goes every 2 years 3.  Dr. Deboraha Sprang, dentist, last visit 2021, goes q 6 months  Patient Care Team: Unk Pinto, MD as PCP - General (Internal Medicine) Inda Castle, MD (Inactive) as Consulting Physician (Gastroenterology)  Surgical: He  has a past surgical history that includes Cerebral aneurysm repair (1966). Family His family history includes Alzheimer's disease in his mother; Cancer in his father; Diabetes in his sister. Social history  He reports that he quit smoking about 46 years ago. He has never used smokeless tobacco. He reports that he does not drink alcohol and does not use drugs.  MEDICARE WELLNESS OBJECTIVES: Physical activity: Current Exercise Habits: The patient does not participate in regular exercise at present, Exercise limited by: None identified Cardiac risk factors: Cardiac Risk Factors include: advanced age (>36men, >62 women);dyslipidemia;hypertension;smoking/ tobacco exposure Depression/mood screen:   Depression screen [facility] 2/9 08/25/2020  Decreased Interest 0  Down, Depressed, Hopeless 0  PHQ - 2 Score 0    ADLs:  In your present state of health, do you have any difficulty performing the following activities: 08/25/2020 02/10/2020  Hearing? N N  Vision? N N  Difficulty concentrating or making decisions? N N  Walking or climbing stairs? N N  Dressing or bathing? N N  Doing errands, shopping? N N  Some recent data might be hidden     Cognitive  Testing  Alert? Yes  Normal Appearance?Yes  Oriented to person? Yes  Place? Yes   Time? Yes  Recall of three objects?  1/3  Can perform simple calculations? Yes  Displays appropriate judgment?Yes  Can read the correct time from a watch face?Yes  EOL planning: Does Patient Have a Medical Advance Directive?: Yes Type of Advance Directive: [place] will Does patient want to make changes to medical advance directive?: No - Patient declined Copy of [place] in Chart?: No -  copy requested   Objective:   Today's Vitals   08/25/20 0834  BP: 126/74  Pulse: (!) 57  Temp: (!) 97.5 F (36.4 C)  SpO2: 99%  Weight: 193 lb (87.5 kg)  Height: 5\' 11"  (1.803 m)   Body mass index is 26.92 kg/m.  General appearance: alert, no distress, WD/WN, male HEENT: normocephalic, sclerae anicteric, TMs pearly, nares patent, no discharge or erythema, pharynx normal Oral cavity: MMM, no lesions Neck: supple, no lymphadenopathy, no thyromegaly, no masses Heart: RRR, normal S1, S2, no murmurs Lungs: CTA bilaterally, no wheezes, rhonchi, or rales Abdomen: +bs, soft, non tender, non distended, no masses, no hepatomegaly, no splenomegaly Musculoskeletal: nontender, no swelling, no obvious deformity Extremities: no edema, no cyanosis, no clubbing Pulses: 2+ symmetric, upper and lower extremities, normal cap refill Neurological: alert, oriented x 3, CN2-12 intact, strength normal upper extremities and lower extremities, sensation normal throughout, DTRs 2+ throughout, no cerebellar signs, gait normal Psychiatric: normal affect, behavior normal, pleasant   Medicare Attestation I have personally reviewed: The patient's medical and social history Their use of alcohol, tobacco or illicit drugs Their current medications and supplements The patient's functional ability including ADLs,fall risks, home safety risks, cognitive, and hearing and visual impairment Diet and physical activities Evidence for depression or mood disorders  The patient's weight, height, BMI, and visual acuity have been recorded in the chart.  I have made referrals, counseling, and provided education to the patient based on review of the above and I have provided the patient with a written personalized care plan for preventive services.     Izora Ribas, NP   12/01/2020

## 2020-08-23 ENCOUNTER — Other Ambulatory Visit: Payer: Self-pay | Admitting: Internal Medicine

## 2020-08-25 ENCOUNTER — Ambulatory Visit (INDEPENDENT_AMBULATORY_CARE_PROVIDER_SITE_OTHER): Payer: PPO | Admitting: Adult Health

## 2020-08-25 ENCOUNTER — Encounter: Payer: Self-pay | Admitting: Adult Health

## 2020-08-25 ENCOUNTER — Ambulatory Visit: Payer: PPO | Admitting: Physician Assistant

## 2020-08-25 ENCOUNTER — Other Ambulatory Visit: Payer: Self-pay

## 2020-08-25 VITALS — BP 126/74 | HR 57 | Temp 97.5°F | Ht 71.0 in | Wt 193.0 lb

## 2020-08-25 DIAGNOSIS — Z79899 Other long term (current) drug therapy: Secondary | ICD-10-CM | POA: Diagnosis not present

## 2020-08-25 DIAGNOSIS — Z9889 Other specified postprocedural states: Secondary | ICD-10-CM

## 2020-08-25 DIAGNOSIS — Z0001 Encounter for general adult medical examination with abnormal findings: Secondary | ICD-10-CM

## 2020-08-25 DIAGNOSIS — Z0000 Encounter for general adult medical examination without abnormal findings: Secondary | ICD-10-CM

## 2020-08-25 DIAGNOSIS — E782 Mixed hyperlipidemia: Secondary | ICD-10-CM | POA: Diagnosis not present

## 2020-08-25 DIAGNOSIS — I10 Essential (primary) hypertension: Secondary | ICD-10-CM

## 2020-08-25 DIAGNOSIS — R7309 Other abnormal glucose: Secondary | ICD-10-CM

## 2020-08-25 DIAGNOSIS — K219 Gastro-esophageal reflux disease without esophagitis: Secondary | ICD-10-CM

## 2020-08-25 DIAGNOSIS — N400 Enlarged prostate without lower urinary tract symptoms: Secondary | ICD-10-CM

## 2020-08-25 DIAGNOSIS — R6889 Other general symptoms and signs: Secondary | ICD-10-CM | POA: Diagnosis not present

## 2020-08-25 DIAGNOSIS — Z1159 Encounter for screening for other viral diseases: Secondary | ICD-10-CM | POA: Diagnosis not present

## 2020-08-25 DIAGNOSIS — E663 Overweight: Secondary | ICD-10-CM | POA: Diagnosis not present

## 2020-08-25 DIAGNOSIS — E559 Vitamin D deficiency, unspecified: Secondary | ICD-10-CM

## 2020-08-25 DIAGNOSIS — Z23 Encounter for immunization: Secondary | ICD-10-CM

## 2020-08-25 DIAGNOSIS — Z8679 Personal history of other diseases of the circulatory system: Secondary | ICD-10-CM

## 2020-08-25 NOTE — Patient Instructions (Signed)
Brendan Monroe , Thank you for taking time to come for your Medicare Wellness Visit. I appreciate your ongoing commitment to your health goals. Please review the following plan we discussed and let me know if I can assist you in the future.   These are the goals we discussed: Goals    . Exercise 150 min/wk Moderate Activity       This is a list of the screening recommended for you and due dates:  Health Maintenance  Topic Date Due  .  Hepatitis C: One time screening is recommended by Center for Disease Control  (CDC) for  adults born from [birth year] through 1967.   Never done  . Flu Shot  06/08/2020  . COVID-19 Vaccine (1) 09/10/2020*  . Colon Cancer Screening  10/11/2023  . Tetanus Vaccine  04/21/2028  . Pneumonia vaccines  Completed  *Topic was postponed. The date shown is not the original due date.     Exercise Information for Aging Adults Staying physically active is important as you age. The four types of exercises that are best for older adults are endurance, strength, balance, and flexibility. Contact your health care provider before you start any exercise routine. Ask your health care provider what activities are safe for you. What are the risks? Risks associated with exercising include:  Overdoing it. This may lead to sore muscles or fatigue.  Falls.  Injuries.  Dehydration. How to do these exercises Endurance exercises Endurance (aerobic) exercises raise your breathing rate and heart rate. Increasing your endurance helps you to do everyday tasks and stay healthy. By improving the health of your body system that includes your heart, lungs, and blood vessels (circulatory system), you may also delay or prevent diseases such as heart disease, diabetes, and bone loss (osteoporosis). Types of endurance exercises include:  Sports.  Indoor activities, such as using gym equipment, doing water aerobics, or dancing.  Outdoor activities, such as biking or jogging.  Tasks around the  house, such as gardening, yard work, and heavy household chores like cleaning.  Walking, such as hiking or walking around your neighborhood. When doing endurance exercises, make sure you:  Are aware of your surroundings.  Use safety equipment as directed.  Dress in layers when exercising outdoors.  Drink plenty of water to stay well hydrated. Build up endurance slowly. Start with 10 minutes at a time, and gradually build up to doing 30 minutes at a time. Unless your health care provider gave you different instructions, aim to exercise for a total of 150 minutes a week. Spread out that time so you are working on endurance on 3 or more days a week. Strength exercises Lifting, pulling, or pushing weights helps to strengthen muscles. Having stronger muscles makes it easier to do everyday activities, such as getting up from a chair, climbing stairs, carrying groceries, and playing with grandchildren. Strength exercises include arm and leg exercises that may be done:  With weights.  Without weights (using your own body weight).  With a resistance band. When doing strength exercises:  Move smoothly and steadily. Do not suddenly thrust or jerk the weights, the resistance band, or your body.  Start with no weights or with light weights, and gradually add more weight over time. Eventually, aim to use weights that are hard or very hard for you to lift. This means that you are able to do 8 repetitions with the weight, and the last few repetitions are very challenging.  Lift or push weights into position for  3 seconds, hold the position for 1 second, and then take 3 seconds to return to your starting position.  Breathe out (exhale) during difficult movements, like lifting or pushing weights. Breathe in (inhale) to relax your muscles before the next repetition.  Consider alternating arms or legs, especially when you first start strength exercises.  Expect some slight muscle soreness after each  session. Do strength exercises on 2 or more days a week, for 30 minutes at a time. Avoid exercising the same muscle groups two days in a row. For example, if you work on your leg muscles one day, work on your arm muscles the next day. When you can do two sets of 10-15 repetitions with a certain weight, increase the amount of weight. Balance Balance exercises can help to prevent falls. Balance exercises include:  Standing on one foot.  Heel-to-toe walk.  Balance walk.  Tai chi. Make sure you have something sturdy to hold onto while doing balance exercises, such as a sturdy chair. As your balance improves, challenge yourself by holding onto the chair with one hand instead of two, and then with no hands. Trying exercises with your eyes closed also challenges your balance, but be sure to have a sturdy surface (like a countertop) close by in case you need it. Do balance exercises as often as you want, or as often as directed by your health care provider. Strength exercises for the lower body also help to improve balance. Flexibility  Flexibility exercises improve how far you can bend, straighten, move, or rotate parts of your body (range of motion). These exercises also help you to do everyday activities such as getting dressed or reaching for objects. Flexibility exercises include stretching different parts of the body, and they may be done in a standing or seated position or on the floor. When stretching, make sure you:  Keep a slight bend in your arms and legs. Avoid completely straightening ("locking") your joints.  Do not stretch so far that you feel pain. You should feel a mild stretching feeling. You may try stretching farther as you become more flexible over time.  Relax and breathe between stretches.  Hold onto something sturdy for balance as needed. Hold each stretch for 10-30 seconds. Repeat each stretch 3-5 times. General safety tips  Exercise in well-lit areas.  Do not hold  your breath during exercises or stretches.  Warm up before exercising, and cool down after exercising. This can help prevent injury.  Drink plenty of water during exercise or any activity that makes you sweat.  Use smooth, steady movements. Do not use sudden, jerking movements, especially when lifting weights or doing flexibility exercises.  If you are not sure if an exercise is safe for you, or you are not sure how to do an exercise, talk with your health care provider. This is especially important if you have had surgery on muscles, bones, or joints (orthopedic surgery). Where to find more information You can find more information about exercise for older adults from:  Your local health department, fitness center, or community center. These facilities may have programs for aging adults.  [employer] on Aging: http://kim-miller.com/  National Council on Aging: www.ncoa.org Summary  Staying physically active is important as you age.  Make sure to contact your health care provider before you start any exercise routine. Ask your health care provider what activities are safe for you.  Doing endurance, strength, balance, and flexibility exercises can help to delay or prevent certain diseases, such  as heart disease, diabetes, and bone loss (osteoporosis). This information is not intended to replace advice given to you by your health care provider. Make sure you discuss any questions you have with your health care provider. Document Revised: 08/17/2018 Document Reviewed: 03/16/2017 Elsevier Patient Education  [place].

## 2020-08-26 ENCOUNTER — Ambulatory Visit: Payer: PPO | Admitting: Internal Medicine

## 2020-08-26 DIAGNOSIS — H57813 Brow ptosis, bilateral: Secondary | ICD-10-CM | POA: Diagnosis not present

## 2020-08-26 DIAGNOSIS — H02831 Dermatochalasis of right upper eyelid: Secondary | ICD-10-CM | POA: Diagnosis not present

## 2020-08-26 DIAGNOSIS — H02834 Dermatochalasis of left upper eyelid: Secondary | ICD-10-CM | POA: Diagnosis not present

## 2020-08-26 LAB — COMPLETE METABOLIC PANEL WITH GFR
AG Ratio: 1.6 (calc) (ref 1.0–2.5)
ALT: 19 U/L (ref 9–46)
AST: 25 U/L (ref 10–35)
Albumin: 4.1 g/dL (ref 3.6–5.1)
Alkaline phosphatase (APISO): 77 U/L (ref 35–144)
BUN: 18 mg/dL (ref 7–25)
CO2: 30 mmol/L (ref 20–32)
Calcium: 9.6 mg/dL (ref 8.6–10.3)
Chloride: 105 mmol/L (ref 98–110)
Creat: 1.08 mg/dL (ref 0.70–1.18)
GFR, Est African American: 77 mL/min/{1.73_m2} (ref >=60)
GFR, Est Non African American: 66 mL/min/{1.73_m2} (ref >=60)
Globulin: 2.5 g/dL (calc) (ref 1.9–3.7)
Glucose, Bld: 94 mg/dL (ref 65–99)
Potassium: 3.7 mmol/L (ref 3.5–5.3)
Sodium: 142 mmol/L (ref 135–146)
Total Bilirubin: 0.6 mg/dL (ref 0.2–1.2)
Total Protein: 6.6 g/dL (ref 6.1–8.1)

## 2020-08-26 LAB — CBC WITH DIFFERENTIAL/PLATELET
Absolute Monocytes: 610 cells/uL (ref 200–950)
Basophils Absolute: 67 cells/uL (ref 0–200)
Basophils Relative: 1.2 %
Eosinophils Absolute: 269 cells/uL (ref 15–500)
Eosinophils Relative: 4.8 %
HCT: 43.2 % (ref 38.5–50.0)
Hemoglobin: 14.3 g/dL (ref 13.2–17.1)
Lymphs Abs: 1445 cells/uL (ref 850–3900)
MCH: 30.1 pg (ref 27.0–33.0)
MCHC: 33.1 g/dL (ref 32.0–36.0)
MCV: 90.9 fL (ref 80.0–100.0)
MPV: 10.4 fL (ref 7.5–12.5)
Monocytes Relative: 10.9 %
Neutro Abs: 3209 cells/uL (ref 1500–7800)
Neutrophils Relative %: 57.3 %
Platelets: 206 10*3/uL (ref 140–400)
RBC: 4.75 10*6/uL (ref 4.20–5.80)
RDW: 12.8 % (ref 11.0–15.0)
Total Lymphocyte: 25.8 %
WBC: 5.6 10*3/uL (ref 3.8–10.8)

## 2020-08-26 LAB — HEPATITIS C ANTIBODY
Hepatitis C Ab: NONREACTIVE
SIGNAL TO CUT-OFF: 0.01 (ref <1.00)

## 2020-08-26 LAB — LIPID PANEL
Cholesterol: 138 mg/dL (ref <200)
HDL: 53 mg/dL (ref >=40)
LDL Cholesterol (Calc): 70 mg/dL (calc)
Non-HDL Cholesterol (Calc): 85 mg/dL (calc) (ref <130)
Total CHOL/HDL Ratio: 2.6 (calc) (ref <5.0)
Triglycerides: 70 mg/dL (ref <150)

## 2020-08-26 LAB — TSH: TSH: 3.68 mIU/L (ref 0.40–4.50)

## 2020-08-26 LAB — MAGNESIUM: Magnesium: 2.1 mg/dL (ref 1.5–2.5)

## 2020-10-20 ENCOUNTER — Telehealth: Payer: Self-pay

## 2020-10-20 NOTE — Telephone Encounter (Addendum)
Patient is requesting a letter or medical records stating how long he has been on blood pressure medication and what diagnosis he has. Requesting this for the [place] for Disability benefits.

## 2020-10-22 NOTE — Telephone Encounter (Signed)
Printed office note and patient's wife notified and daughter will come by office to pick it up.

## 2020-11-03 ENCOUNTER — Other Ambulatory Visit: Payer: Self-pay | Admitting: Internal Medicine

## 2020-11-04 ENCOUNTER — Telehealth: Payer: Self-pay | Admitting: *Deleted

## 2020-11-04 NOTE — Telephone Encounter (Signed)
Left message to inform patient on 11/03/2020, that letter requested for [employer] would be ready for pick up in the morning.

## 2020-11-05 ENCOUNTER — Ambulatory Visit: Payer: PPO | Admitting: Adult Health Nurse Practitioner

## 2020-11-11 ENCOUNTER — Telehealth: Payer: Self-pay | Admitting: *Deleted

## 2020-11-11 NOTE — Telephone Encounter (Signed)
Left a message in response to patient and his spouse request for a letter detailing the patient's hypertension from 1966 to 1990. Per Dr Oneta Rack, the examiner at the [facility] needs to send him a letter detailing what she is requiring. Dr Oneta Rack could not have been his physician until after 1980, when he went into practice.

## 2020-11-20 ENCOUNTER — Telehealth: Payer: Self-pay | Admitting: *Deleted

## 2020-11-20 ENCOUNTER — Encounter: Payer: Self-pay | Admitting: Internal Medicine

## 2020-11-20 NOTE — Telephone Encounter (Signed)
Left message to inform patient and spouse the surgical date of aneurysm repair has been corrected to 1966. Per Dr Melford Aase, his cerebral hemorrhage would not be the cause his his elevated BP currently.

## 2020-12-04 ENCOUNTER — Telehealth: Payer: Self-pay | Admitting: *Deleted

## 2020-12-04 NOTE — Telephone Encounter (Signed)
Faxed office notes with corrected date of aneurysm surgery from 1996 to 1966. Patient was advised information has been faxed.

## 2021-02-16 ENCOUNTER — Encounter: Payer: PPO | Admitting: Internal Medicine

## 2021-02-25 ENCOUNTER — Other Ambulatory Visit: Payer: Self-pay | Admitting: Internal Medicine

## 2021-02-25 MED ORDER — SIMVASTATIN 40 MG PO TABS: ORAL_TABLET | ORAL | 3 refills | Status: DC

## 2021-03-10 ENCOUNTER — Encounter: Payer: Self-pay | Admitting: Internal Medicine

## 2021-03-10 NOTE — Progress Notes (Signed)
Annual  Screening/Preventative Visit  & Comprehensive Evaluation & Examination  Future Appointments  Date Time Provider [place]  03/11/2021  3:00 PM  -  CPE  Unk Pinto, MD [facility] None  08/25/2021 11:00 AM  - Wellness [place], [place], NP [facility] None  03/15/2022  3:00 PM Unk Pinto, MD [facility] None            This very nice 75 y.o.  MWM presents for a Screening /Preventative Visit & comprehensive evaluation and management of multiple medical co-morbidities.  Patient has been followed for HTN, HLD, Prediabetes and Vitamin D Deficiency. Patient has hx/o GERD controlled on his meds.        HTN predates since 1996. Patient's BP has been controlled at home.  Today's BP is at goal - 128/84. Patient denies any cardiac symptoms as chest pain, palpitations, shortness of breath, dizziness or ankle swelling.       Patient's hyperlipidemia is controlled with diet /Simvastatin . Patient denies myalgias or other medication SE's. Last lipids were at goal:  Lab Results  Component Value Date   CHOL 138 08/25/2020   HDL 53 08/25/2020   LDLCALC 70 08/25/2020   TRIG 70 08/25/2020   CHOLHDL 2.6 08/25/2020         Patient has hx/o prediabetes (A1c 5.9% /2010) and patient denies reactive hypoglycemic symptoms, visual blurring, diabetic polys or paresthesias. Last A1c was normal:  Lab Results  Component Value Date   HGBA1C 5.3 02/11/2020          Finally, patient has history of Vitamin D Deficiency ("41" /2008) and last vitamin D was at goal:  Lab Results  Component Value Date   VD25OH 69 02/11/2020     Current Outpatient Medications on File Prior to Visit  Medication Sig  . VITAMIN C Take 1 tablet daily.  Marland Kitchen aspirin 81 MG tablet Take  daily.  Marland Kitchen atenolol 25 MG tablet Takes 1 tablet daily for BP  . VITAMIN D  Take 2,000 Units  2  times daily.   . hydrochlorothiazide  25 MG  Take 1 tablet Daily  . Omega-3 FISH OIL Take \\daily .  . simvastatin 40 MG tablet  Take  1 tablet  at Bedtime\l  . zinc  50 MG tablet Take  daily.    No Known Allergies   Past Medical History:  Diagnosis Date  . BPH (benign prostatic hyperplasia)   . GERD (gastroesophageal reflux disease)   . Hyperlipidemia   . Hypertension   . Prediabetes   . Vitamin D deficiency      Health Maintenance  Topic Date Due  . COVID-19 Vaccine (1) Never done  . INFLUENZA VACCINE  06/08/2021  . COLONOSCOPY  10/11/2023  . TETANUS/TDAP  04/21/2028  . Hepatitis C Screening  Completed  . PNA vac Low Risk Adult  Completed  . HPV VACCINES  Aged Out     Immunization History  Administered Date(s) Administered  . Influenza, High Dose  08/21/2014, 09/09/2015, 07/06/2016, 08/25/2020  . Influenza-  08/08/2018, 08/16/2019  . Pneumococcal  -13 01/30/2019  . Pneumococcal -23 06/05/2009  . Td 01/26/2007, 04/21/2018    Last Colon - 10/10/2018 - Dr Romilda Garret - recc 5 yr f/u - due Dec 2024   Past Surgical History:  Procedure Laterality Date  . CEREBRAL ANEURYSM REPAIR  1966     Family History  Problem Relation Age of Onset  . Alzheimer's disease Mother   . Cancer Father  colon  . Diabetes Sister     Social History   Socioeconomic History  . Marital status: Married  . Number of children: 1 son & 1 daughter & 10 [place]     Occupational History  . 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 Smoking status: Former Smoker    Quit date: 04/30/1974    Years since quitting: 46.8  . Smokeless tobacco: Never Used  Substance and Sexual Activity  . Alcohol use: No  . Drug use: No  . Sexual activity: Not on file    ROS Constitutional: Denies fever, chills, weight loss/gain, headaches, insomnia,  night sweats or change in appetite. Does c/o fatigue. Eyes: Denies redness, blurred vision, diplopia, discharge, itchy or watery eyes.  ENT: Denies discharge, congestion, post nasal drip, epistaxis, sore throat, earache, hearing loss, dental pain,  Tinnitus, Vertigo, Sinus pain or snoring.  Cardio: Denies chest pain, palpitations, irregular heartbeat, syncope, dyspnea, diaphoresis, orthopnea, PND, claudication or edema Respiratory: denies cough, dyspnea, DOE, pleurisy, hoarseness, laryngitis or wheezing.  Gastrointestinal: Denies dysphagia, heartburn, reflux, water brash, pain, cramps, nausea, vomiting, bloating, diarrhea, constipation, hematemesis, melena, hematochezia, jaundice or hemorrhoids Genitourinary: Denies dysuria, frequency, discharge, hematuria or flank pain. Has urgency, nocturia x 2-3 & occasional hesitancy & decreased urinary stream. Musculoskeletal: Denies arthralgia, myalgia, stiffness, Jt. Swelling, pain, limp or strain/sprain. Denies Falls. Skin: Denies puritis, rash, hives, warts, acne, eczema or change in skin lesion Neuro: No weakness, tremor, incoordination, spasms, paresthesia or pain Psychiatric: Denies confusion, memory loss or sensory loss. Denies Depression. Endocrine: Denies change in weight, skin, hair change, nocturia, and paresthesia, diabetic polys, visual blurring or hyper / hypo glycemic episodes.  Heme/Lymph: No excessive bleeding, bruising or enlarged lymph nodes.   Physical Exam  BP 128/84   Pulse 65   Temp (!) 97.3 F (36.3 C)   Resp 16   Ht 5\' 10"  (1.778 m)   Wt 191 lb 12.8 oz (87 kg)   SpO2 97%   BMI 27.52 kg/m   General Appearance: Well nourished and well groomed and in no apparent distress.  Eyes: PERRLA, EOMs, conjunctiva no swelling or erythema, normal fundi and vessels. Sinuses: No frontal/maxillary tenderness ENT/Mouth: EACs patent / TMs  nl. Nares clear without erythema, swelling, mucoid exudates. Oral hygiene is good. No erythema, swelling, or exudate. Tongue normal, non-obstructing. Tonsils not swollen or erythematous. Hearing normal.  Neck: Supple, thyroid not palpable. No bruits, nodes or JVD. Respiratory: Respiratory effort normal.  BS equal and clear bilateral without rales,  rhonci, wheezing or stridor. Cardio: Heart sounds are normal with regular rate and rhythm and no murmurs, rubs or gallops. Peripheral pulses are normal and equal bilaterally without edema. No aortic or femoral bruits. Chest: symmetric with normal excursions and percussion.  Abdomen: Soft, with Nl bowel sounds. Nontender, no guarding, rebound, hernias, masses, or organomegaly.  Lymphatics: Non tender without lymphadenopathy.  Musculoskeletal: Full ROM all peripheral extremities, joint stability, 5/5 strength, and normal gait. Skin: Warm and dry without rashes, lesions, cyanosis, clubbing or  ecchymosis.  Neuro: Cranial nerves intact, reflexes equal bilaterally. Normal muscle tone, no cerebellar symptoms. Sensation intact.  Pysch: Alert and oriented X 3 with normal affect, insight and judgment appropriate.   Assessment and Plan  1. Annual Preventative/Screening Exam    2. Essential hypertension  - EKG 12-Lead - [place], RETROPERITNL ABD,  LTD - Urinalysis, Routine w reflex microscopic - Microalbumin / creatinine urine ratio - CBC with Differential/Platelet - COMPLETE  METABOLIC PANEL WITH GFR - Magnesium - TSH  3. Hyperlipidemia, mixed  - Lipid panel - TSH  4. Abnormal glucose  - Hemoglobin A1c - Insulin, random  5. Vitamin D deficiency  - VITAMIN D 25 Hydroxy   6. BPH with obstruction/lower urinary tract symptoms  - PSA  7. Gastroesophageal reflux disease  - CBC with Differential/Platelet  8. History of cerebral aneurysm repair   9. Screening for colorectal cancer  - POC Hemoccult Bld/Stl   10. Screening for prostate cancer  - PSA  11. Screening for ischemic heart disease  - EKG 12-Lead  12. Former smoker  - EKG 12-Lead - [place], RETROPERITNL ABD,  LTD  13. Screening for AAA (aortic abdominal aneurysm)  - Urinalysis, Routine w reflex microscopic  14. Medication management  - Microalbumin / creatinine urine ratio - CBC with Differential/Platelet -  COMPLETE METABOLIC PANEL WITH GFR - Magnesium - Lipid panel - TSH - Hemoglobin A1c - Insulin, random - VITAMIN D 25 Hydroxyl          Patient was counseled in prudent diet, weight control to achieve/maintain BMI less than 25, BP monitoring, regular exercise and medications as discussed.  Discussed med effects and SE's. Routine screening labs and tests as requested with regular follow-up as recommended. Over 40 minutes of exam, counseling, chart review and high complex critical decision making was performed   Kirtland Bouchard, MD

## 2021-03-11 ENCOUNTER — Ambulatory Visit (INDEPENDENT_AMBULATORY_CARE_PROVIDER_SITE_OTHER): Payer: PPO | Admitting: Internal Medicine

## 2021-03-11 ENCOUNTER — Other Ambulatory Visit: Payer: Self-pay

## 2021-03-11 VITALS — BP 128/84 | HR 65 | Temp 97.3°F | Resp 16 | Ht 70.0 in | Wt 191.8 lb

## 2021-03-11 DIAGNOSIS — K219 Gastro-esophageal reflux disease without esophagitis: Secondary | ICD-10-CM

## 2021-03-11 DIAGNOSIS — Z87891 Personal history of nicotine dependence: Secondary | ICD-10-CM

## 2021-03-11 DIAGNOSIS — Z0001 Encounter for general adult medical examination with abnormal findings: Secondary | ICD-10-CM

## 2021-03-11 DIAGNOSIS — E782 Mixed hyperlipidemia: Secondary | ICD-10-CM

## 2021-03-11 DIAGNOSIS — Z125 Encounter for screening for malignant neoplasm of prostate: Secondary | ICD-10-CM

## 2021-03-11 DIAGNOSIS — Z79899 Other long term (current) drug therapy: Secondary | ICD-10-CM | POA: Diagnosis not present

## 2021-03-11 DIAGNOSIS — Z0000 Encounter for general adult medical examination without abnormal findings: Secondary | ICD-10-CM | POA: Diagnosis not present

## 2021-03-11 DIAGNOSIS — N401 Enlarged prostate with lower urinary tract symptoms: Secondary | ICD-10-CM

## 2021-03-11 DIAGNOSIS — Z8679 Personal history of other diseases of the circulatory system: Secondary | ICD-10-CM

## 2021-03-11 DIAGNOSIS — E559 Vitamin D deficiency, unspecified: Secondary | ICD-10-CM | POA: Diagnosis not present

## 2021-03-11 DIAGNOSIS — Z1211 Encounter for screening for malignant neoplasm of colon: Secondary | ICD-10-CM

## 2021-03-11 DIAGNOSIS — N138 Other obstructive and reflux uropathy: Secondary | ICD-10-CM | POA: Diagnosis not present

## 2021-03-11 DIAGNOSIS — Z136 Encounter for screening for cardiovascular disorders: Secondary | ICD-10-CM

## 2021-03-11 DIAGNOSIS — R7309 Other abnormal glucose: Secondary | ICD-10-CM | POA: Diagnosis not present

## 2021-03-11 DIAGNOSIS — I10 Essential (primary) hypertension: Secondary | ICD-10-CM | POA: Diagnosis not present

## 2021-03-11 DIAGNOSIS — Z9889 Other specified postprocedural states: Secondary | ICD-10-CM

## 2021-03-11 MED ORDER — TAMSULOSIN HCL 0.4 MG PO CAPS: ORAL_CAPSULE | ORAL | 3 refills | Status: DC

## 2021-03-11 MED ORDER — FINASTERIDE 5 MG PO TABS: ORAL_TABLET | ORAL | 3 refills | Status: DC

## 2021-03-11 NOTE — Progress Notes (Signed)
AortaScan < 3 cm. Within normal limits, per Dr McKeown. 

## 2021-03-11 NOTE — Patient Instructions (Signed)

## 2021-03-12 DIAGNOSIS — H02834 Dermatochalasis of left upper eyelid: Secondary | ICD-10-CM | POA: Diagnosis not present

## 2021-03-12 DIAGNOSIS — H25813 Combined forms of age-related cataract, bilateral: Secondary | ICD-10-CM | POA: Diagnosis not present

## 2021-03-12 DIAGNOSIS — D229 Melanocytic nevi, unspecified: Secondary | ICD-10-CM | POA: Diagnosis not present

## 2021-03-12 DIAGNOSIS — L814 Other melanin hyperpigmentation: Secondary | ICD-10-CM | POA: Diagnosis not present

## 2021-03-12 DIAGNOSIS — H02831 Dermatochalasis of right upper eyelid: Secondary | ICD-10-CM | POA: Diagnosis not present

## 2021-03-12 DIAGNOSIS — H04123 Dry eye syndrome of bilateral lacrimal glands: Secondary | ICD-10-CM | POA: Diagnosis not present

## 2021-03-12 DIAGNOSIS — L821 Other seborrheic keratosis: Secondary | ICD-10-CM | POA: Diagnosis not present

## 2021-03-12 LAB — INSULIN, RANDOM: Insulin: 100.4 u[IU]/mL — ABNORMAL HIGH

## 2021-03-12 LAB — COMPLETE METABOLIC PANEL WITH GFR
AG Ratio: 2 (calc) (ref 1.0–2.5)
ALT: 19 U/L (ref 9–46)
AST: 22 U/L (ref 10–35)
Albumin: 4.4 g/dL (ref 3.6–5.1)
Alkaline phosphatase (APISO): 75 U/L (ref 35–144)
BUN: 21 mg/dL (ref 7–25)
CO2: 29 mmol/L (ref 20–32)
Calcium: 9.7 mg/dL (ref 8.6–10.3)
Chloride: 103 mmol/L (ref 98–110)
Creat: 1.18 mg/dL (ref 0.70–1.18)
GFR, Est African American: 69 mL/min/{1.73_m2} (ref >=60)
GFR, Est Non African American: 60 mL/min/{1.73_m2} (ref >=60)
Globulin: 2.2 g/dL (calc) (ref 1.9–3.7)
Glucose, Bld: 97 mg/dL (ref 65–99)
Potassium: 3.8 mmol/L (ref 3.5–5.3)
Sodium: 142 mmol/L (ref 135–146)
Total Bilirubin: 0.6 mg/dL (ref 0.2–1.2)
Total Protein: 6.6 g/dL (ref 6.1–8.1)

## 2021-03-12 LAB — LIPID PANEL
Cholesterol: 144 mg/dL (ref <200)
HDL: 50 mg/dL (ref >=40)
LDL Cholesterol (Calc): 73 mg/dL (calc)
Non-HDL Cholesterol (Calc): 94 mg/dL (calc) (ref <130)
Total CHOL/HDL Ratio: 2.9 (calc) (ref <5.0)
Triglycerides: 124 mg/dL (ref <150)

## 2021-03-12 LAB — URINALYSIS, ROUTINE W REFLEX MICROSCOPIC
Bilirubin Urine: NEGATIVE
Glucose, UA: NEGATIVE
Hgb urine dipstick: NEGATIVE
Leukocytes,Ua: NEGATIVE
Nitrite: NEGATIVE
Protein, ur: NEGATIVE
Specific Gravity, Urine: 1.023 (ref 1.001–1.035)
pH: <=5 (ref 5.0–8.0)

## 2021-03-12 LAB — CBC WITH DIFFERENTIAL/PLATELET
Absolute Monocytes: 722 cells/uL (ref 200–950)
Basophils Absolute: 72 cells/uL (ref 0–200)
Basophils Relative: 1.1 %
Eosinophils Absolute: 163 cells/uL (ref 15–500)
Eosinophils Relative: 2.5 %
HCT: 43.2 % (ref 38.5–50.0)
Hemoglobin: 14.5 g/dL (ref 13.2–17.1)
Lymphs Abs: 1599 cells/uL (ref 850–3900)
MCH: 30.4 pg (ref 27.0–33.0)
MCHC: 33.6 g/dL (ref 32.0–36.0)
MCV: 90.6 fL (ref 80.0–100.0)
MPV: 10.3 fL (ref 7.5–12.5)
Monocytes Relative: 11.1 %
Neutro Abs: 3946 cells/uL (ref 1500–7800)
Neutrophils Relative %: 60.7 %
Platelets: 236 10*3/uL (ref 140–400)
RBC: 4.77 10*6/uL (ref 4.20–5.80)
RDW: 12.8 % (ref 11.0–15.0)
Total Lymphocyte: 24.6 %
WBC: 6.5 10*3/uL (ref 3.8–10.8)

## 2021-03-12 LAB — TSH: TSH: 3.63 mIU/L (ref 0.40–4.50)

## 2021-03-12 LAB — MICROALBUMIN / CREATININE URINE RATIO
Creatinine, Urine: 191 mg/dL (ref 20–320)
Microalb Creat Ratio: 4 mcg/mg creat (ref <30)
Microalb, Ur: 0.7 mg/dL

## 2021-03-12 LAB — HEMOGLOBIN A1C
Hgb A1c MFr Bld: 5.1 % of total Hgb (ref <5.7)
Mean Plasma Glucose: 100 mg/dL
eAG (mmol/L): 5.5 mmol/L

## 2021-03-12 LAB — MAGNESIUM: Magnesium: 2.1 mg/dL (ref 1.5–2.5)

## 2021-03-12 LAB — PSA: PSA: 0.81 ng/mL (ref <=4.00)

## 2021-03-12 LAB — VITAMIN D 25 HYDROXY (VIT D DEFICIENCY, FRACTURES): Vit D, 25-Hydroxy: 89 ng/mL (ref 30–100)

## 2021-03-12 NOTE — Progress Notes (Signed)
============================================================ -   Test results slightly outside the reference range are not unusual. If there is anything important, I will review this with you,  otherwise it is considered normal test values.  If you have further questions,  please do not hesitate to contact me at the office or via My Chart.  ============================================================ ============================================================  -  PSA - Low - Great  ! ============================================================ ============================================================  - Total Chol = 144   and LDL Chol = 73   -  Both  Excellent   - Very low risk for Heart Attack  / Stroke ========================================================  - A1c - Normal - No Diabetes - Great  ! ============================================================ ============================================================  - Vitamin D = 89 - Excellent  - Please keep dose Vitamin D same  ============================================================ ============================================================  - All Else - CBC - Kidneys - Electrolytes - Liver - Magnesium & Thyroid    - all  Normal / OK ============================================================ ============================================================  -  Keep up the [place] Work  ! ============================================================ ============================================================

## 2021-04-13 ENCOUNTER — Other Ambulatory Visit: Payer: Self-pay | Admitting: Internal Medicine

## 2021-04-13 DIAGNOSIS — I10 Essential (primary) hypertension: Secondary | ICD-10-CM

## 2021-04-13 MED ORDER — HYDROCHLOROTHIAZIDE 25 MG PO TABS: ORAL_TABLET | ORAL | 0 refills | Status: DC

## 2021-04-14 ENCOUNTER — Other Ambulatory Visit: Payer: Self-pay

## 2021-04-14 DIAGNOSIS — Z1211 Encounter for screening for malignant neoplasm of colon: Secondary | ICD-10-CM

## 2021-04-14 LAB — POC HEMOCCULT BLD/STL (HOME/3-CARD/SCREEN)
Card #2 Fecal Occult Blod, POC: NEGATIVE
Card #3 Fecal Occult Blood, POC: NEGATIVE
Fecal Occult Blood, POC: NEGATIVE

## 2021-04-20 DIAGNOSIS — Z1211 Encounter for screening for malignant neoplasm of colon: Secondary | ICD-10-CM

## 2021-04-20 DIAGNOSIS — Z1212 Encounter for screening for malignant neoplasm of rectum: Secondary | ICD-10-CM

## 2021-08-25 ENCOUNTER — Ambulatory Visit: Payer: PPO | Admitting: Adult Health

## 2021-09-10 ENCOUNTER — Other Ambulatory Visit: Payer: Self-pay

## 2021-09-10 DIAGNOSIS — I10 Essential (primary) hypertension: Secondary | ICD-10-CM

## 2021-09-10 MED ORDER — HYDROCHLOROTHIAZIDE 25 MG PO TABS: ORAL_TABLET | ORAL | 0 refills | Status: DC

## 2021-09-11 ENCOUNTER — Other Ambulatory Visit: Payer: Self-pay | Admitting: Internal Medicine

## 2021-09-11 DIAGNOSIS — I10 Essential (primary) hypertension: Secondary | ICD-10-CM

## 2021-09-11 MED ORDER — ATENOLOL 25 MG PO TABS: ORAL_TABLET | ORAL | 2 refills | Status: DC

## 2021-09-11 MED ORDER — HYDROCHLOROTHIAZIDE 12.5 MG PO TABS: ORAL_TABLET | ORAL | 3 refills | Status: DC

## 2021-09-24 NOTE — Progress Notes (Signed)
MEDICARE ANNUAL WELLNESS VISIT AND FOLLOW UP Assessment:   Diagnoses and all orders for this visit:  Encounter for Medicare annual wellness exam Due yearly  Essential hypertension Continue medication Monitor blood pressure at home; call if consistently over 130/80 Continue DASH diet.   Reminder to go to the ER if any CP, SOB, nausea, dizziness, severe HA, changes vision/speech, left arm numbness and tingling and jaw pain. CBC  Gastroesophageal reflux disease, esophagitis presence not specified Well managed by lifestyle  Discussed diet, avoiding triggers and other lifestyle changes  Benign prostatic hyperplasia, unspecified whether lower urinary tract symptoms present Currently off of medications and asymptomatic  Vitamin D deficiency At goal at recent check; continue to recommend supplementation for goal of 60-100 Defer vitamin D level  Other abnormal glucose Recent A1Cs at goal Discussed diet/exercise, weight management  A1C; check CMP  Mixed hyperlipidemia Continue medications Continue low cholesterol diet and exercise.  Check lipid panel.  CMP  Overweight (BMI 25.0-29.9) Long discussion about weight loss, diet, and exercise Recommended diet heavy in fruits and veggies and low in animal meats, cheeses, and dairy products, appropriate calorie intake Discussed appropriate weight for height  Start exercising - 150 min/week Follow up at next visit  History of cerebral aneurysm repair Stable since 1996; control blood pressure  Contact or exposure to chickenpox Varicella IgG titer   Over 30 minutes of exam, counseling, chart review, and critical decision making was performed  Future Appointments  Date Time Provider [place]  03/15/2022  3:00 PM Unk Pinto, MD [facility] None  09/27/2022 11:00 AM Magda Bernheim, NP [facility] None     Plan:   During the course of the visit the patient was educated and counseled about appropriate screening and  preventive services including:   Pneumococcal vaccine  Influenza vaccine Prevnar 13 Td vaccine Screening electrocardiogram Colorectal cancer screening Diabetes screening Glaucoma screening Nutrition counseling    Subjective:  Brendan Umholtz. is a 75 y.o. male who presents for Medicare Annual Wellness Visit and 3 month follow up for HTN, hyperlipidemia, glucose management, and vitamin D Def.   He has hx of cerebral aneurysm repair in 1996 after which he has some mild short term memory loss which has been stable.  he has a diagnosis of GERD which is currently managed by lifestyle, no problems in several years.   BMI is Body mass index is 28.47 kg/m., he has been working on diet- has cut out junk food. Needs to restart walking with wife. He does go fishing regularly at [employer] with some walking, at least 3 days a week.  Wt Readings from Last 3 Encounters:  09/28/21 198 lb 6.4 oz (90 kg)  03/11/21 191 lb 12.8 oz (87 kg)  08/25/20 193 lb (87.5 kg)   His blood pressure has been controlled at home, today their BP is BP: 120/74 He does workout. He denies chest pain, shortness of breath, dizziness.   He is on cholesterol medication (simvastatin 40 mg daily) and denies myalgias. His cholesterol is at goal. The cholesterol last visit was:   Lab Results  Component Value Date   CHOL 144 03/11/2021   HDL 50 03/11/2021   LDLCALC 73 03/11/2021   TRIG 124 03/11/2021   CHOLHDL 2.9 03/11/2021   He has been working on diet and exercise for glucose managment, and denies foot ulcerations, increased appetite, nausea, paresthesia of the feet, polydipsia, polyuria, visual disturbances, vomiting and weight loss. Last A1C in the office was:  Lab  Results  Component Value Date   HGBA1C 5.1 03/11/2021   Last GFR Lab Results  Component Value Date   GFRNONAA 60 03/11/2021    Patient is on Vitamin D supplement.   Lab Results  Component Value Date   VD25OH 50 40/06/6760     He is uncertain  if he has ever had chickenpox and has been exposed.  Medication Review:   Current Outpatient Medications (Cardiovascular):    atenolol (TENORMIN) 25 MG tablet, Takes 1 tablet daily for BP   hydrochlorothiazide (HYDRODIURIL) 12.5 MG tablet, Take 1 tablet Daily for BP & Fluid Retention /Ankle Swelling   simvastatin (ZOCOR) 40 MG tablet, Take  1 tablet  at Bedtime  for Cholesterol   Current Outpatient Medications (Analgesics):    aspirin 81 MG tablet, Take 81 mg by mouth daily.   Current Outpatient Medications (Other):    Ascorbic Acid (VITAMIN C PO), Take 1 tablet by mouth daily.   Cholecalciferol (VITAMIN D PO), Take 2,000 Units by mouth 2 (two) times daily.    finasteride (PROSCAR) 5 MG tablet, Take 1 tablet daily for prostate   Omega-3 Fatty Acids (FISH OIL PO), Take by mouth daily.   tamsulosin (FLOMAX) 0.4 MG CAPS capsule, Take 1 tablet  at Bedtime  for Prostate   zinc gluconate 50 MG tablet, Take 50 mg by mouth daily.  Allergies: No Known Allergies  Current Problems (verified) has Other abnormal glucose; Hyperlipidemia; Hypertension; GERD (gastroesophageal reflux disease); BPH (benign prostatic hyperplasia); Vitamin D deficiency; Medication management; Overweight (BMI 25.0-29.9); and History of cerebral aneurysm repair on their problem list.  Screening Tests Immunization History  Administered Date(s) Administered   Influenza, High Dose Seasonal PF 08/21/2014, 09/09/2015, 07/06/2016, 08/25/2020   Influenza-Unspecified 08/08/2018, 08/16/2019, 08/29/2021   Pneumococcal Conjugate-13 01/30/2019   Pneumococcal Polysaccharide-23 06/05/2009   Td 01/26/2007, 04/21/2018   Preventative care: Last colonoscopy: 10/2018 - Dr. Cristina Gong due 2024  Prior vaccinations: TD or Tdap: 2019 Influenza: 08/25/21  Pneumococcal: 2010 Prevnar13: 2020 Shingles/Zostavax: Declines Covid 19: declines   Names of Other Physician/Practitioners you currently use: 1. [facility] here for primary care 2. Dr. Katy Fitch , eye doctor, last visit 2022 3. Dr. Deboraha Sprang, dentist, last visit 2022, goes q 6 months  Patient Care Team: Unk Pinto, MD as PCP - General (Internal Medicine) Inda Castle, MD (Inactive) as Consulting Physician (Gastroenterology)  Surgical: He  has a past surgical history that includes Cerebral aneurysm repair (1966). Family His family history includes Alzheimer's disease in his mother; Cancer in his father; Diabetes in his sister. Social history  He reports that he quit smoking about 47 years ago. His smoking use included cigarettes. He has never used smokeless tobacco. He reports that he does not drink alcohol and does not use drugs.  MEDICARE WELLNESS OBJECTIVES: Physical activity: Current Exercise Habits: Home exercise routine, Type of exercise: Other - see comments (fishing, walking on the beach), Time (Minutes): 20, Frequency (Times/Week): 3, Weekly Exercise (Minutes/Week): 60, Intensity: Mild Cardiac risk factors: Cardiac Risk Factors include: advanced age (>20men, >7 women);dyslipidemia;hypertension;smoking/ tobacco exposure;sedentary lifestyle Depression/mood screen:   Depression screen [facility] 2/9 09/28/2021  Decreased Interest 0  Down, Depressed, Hopeless 0  PHQ - 2 Score 0    ADLs:  In your present state of health, do you have any difficulty performing the following activities: 09/28/2021  Hearing? N  Vision? N  Difficulty concentrating or making decisions? N  Walking or climbing stairs? N  Dressing or bathing? N  Doing errands, shopping? N  Some recent data might be hidden      Cognitive Testing  Alert? Yes  Normal Appearance?Yes  Oriented to person? Yes  Place? Yes   Time? Yes  Recall of three objects?  1/3  Can perform simple calculations? Yes  Displays appropriate judgment?Yes  Can read the correct time from a watch face?Yes  EOL planning: Does Patient Have a Medical Advance  Directive?: Yes Type of Advance Directive: Healthcare Power of Attorney, Living will Does patient want to make changes to medical advance directive?: No - Patient declined Copy of [place] in Chart?: No - copy requested   Objective:   Today's Vitals   09/28/21 0924  BP: 120/74  Pulse: 67  Temp: 97.7 F (36.5 C)  SpO2: 98%  Weight: 198 lb 6.4 oz (90 kg)    Body mass index is 28.47 kg/m.  General appearance: alert, no distress, WD/WN, male HEENT: normocephalic, sclerae anicteric, TMs pearly, nares patent, no discharge or erythema, pharynx normal Oral cavity: MMM, no lesions Neck: supple, no lymphadenopathy, no thyromegaly, no masses Heart: RRR, normal S1, S2, no murmurs Lungs: CTA bilaterally, no wheezes, rhonchi, or rales Abdomen: +bs, soft, non tender, non distended, no masses, no hepatomegaly, no splenomegaly Musculoskeletal: nontender, no swelling, no obvious deformity Extremities: no edema, no cyanosis, no clubbing Pulses: 2+ symmetric, upper and lower extremities, normal cap refill Neurological: alert, oriented x 3, CN2-12 intact, strength normal upper extremities and lower extremities, sensation normal throughout, DTRs 2+ throughout, no cerebellar signs, gait normal Psychiatric: normal affect, behavior normal, pleasant   Medicare Attestation I have personally reviewed: The patient's medical and social history Their use of alcohol, tobacco or illicit drugs Their current medications and supplements The patient's functional ability including ADLs,fall risks, home safety risks, cognitive, and hearing and visual impairment Diet and physical activities Evidence for depression or mood disorders  The patient's weight, height, BMI, and visual acuity have been recorded in the chart.  I have made referrals, counseling, and provided education to the patient based on review of the above and I have provided the patient with a written personalized care plan for  preventive services.     Magda Bernheim, NP   09/28/2021

## 2021-09-25 ENCOUNTER — Ambulatory Visit: Payer: PPO | Admitting: Nurse Practitioner

## 2021-09-28 ENCOUNTER — Other Ambulatory Visit: Payer: Self-pay

## 2021-09-28 ENCOUNTER — Encounter: Payer: Self-pay | Admitting: Nurse Practitioner

## 2021-09-28 ENCOUNTER — Ambulatory Visit (INDEPENDENT_AMBULATORY_CARE_PROVIDER_SITE_OTHER): Payer: PPO | Admitting: Nurse Practitioner

## 2021-09-28 VITALS — BP 120/74 | HR 67 | Temp 97.7°F | Wt 198.4 lb

## 2021-09-28 DIAGNOSIS — Z8679 Personal history of other diseases of the circulatory system: Secondary | ICD-10-CM

## 2021-09-28 DIAGNOSIS — Z20820 Contact with and (suspected) exposure to varicella: Secondary | ICD-10-CM

## 2021-09-28 DIAGNOSIS — N138 Other obstructive and reflux uropathy: Secondary | ICD-10-CM

## 2021-09-28 DIAGNOSIS — R7309 Other abnormal glucose: Secondary | ICD-10-CM | POA: Diagnosis not present

## 2021-09-28 DIAGNOSIS — Z9889 Other specified postprocedural states: Secondary | ICD-10-CM

## 2021-09-28 DIAGNOSIS — N401 Enlarged prostate with lower urinary tract symptoms: Secondary | ICD-10-CM | POA: Diagnosis not present

## 2021-09-28 DIAGNOSIS — K219 Gastro-esophageal reflux disease without esophagitis: Secondary | ICD-10-CM

## 2021-09-28 DIAGNOSIS — Z0001 Encounter for general adult medical examination with abnormal findings: Secondary | ICD-10-CM | POA: Diagnosis not present

## 2021-09-28 DIAGNOSIS — E782 Mixed hyperlipidemia: Secondary | ICD-10-CM | POA: Diagnosis not present

## 2021-09-28 DIAGNOSIS — I10 Essential (primary) hypertension: Secondary | ICD-10-CM | POA: Diagnosis not present

## 2021-09-28 DIAGNOSIS — E559 Vitamin D deficiency, unspecified: Secondary | ICD-10-CM

## 2021-09-28 DIAGNOSIS — R6889 Other general symptoms and signs: Secondary | ICD-10-CM | POA: Diagnosis not present

## 2021-09-28 DIAGNOSIS — Z0000 Encounter for general adult medical examination without abnormal findings: Secondary | ICD-10-CM

## 2021-09-28 DIAGNOSIS — E663 Overweight: Secondary | ICD-10-CM | POA: Diagnosis not present

## 2021-09-28 NOTE — Patient Instructions (Signed)

## 2021-09-29 LAB — VARICELLA ZOSTER ANTIBODY, IGG: Varicella IgG: 1319 index

## 2021-09-29 LAB — LIPID PANEL
Cholesterol: 134 mg/dL (ref <200)
HDL: 58 mg/dL (ref >=40)
LDL Cholesterol (Calc): 60 mg/dL (calc)
Non-HDL Cholesterol (Calc): 76 mg/dL (calc) (ref <130)
Total CHOL/HDL Ratio: 2.3 (calc) (ref <5.0)
Triglycerides: 82 mg/dL (ref <150)

## 2021-09-29 LAB — CBC WITH DIFFERENTIAL/PLATELET
Absolute Monocytes: 706 cells/uL (ref 200–950)
Basophils Absolute: 50 cells/uL (ref 0–200)
Basophils Relative: 0.8 %
Eosinophils Absolute: 202 cells/uL (ref 15–500)
Eosinophils Relative: 3.2 %
HCT: 45.1 % (ref 38.5–50.0)
Hemoglobin: 15 g/dL (ref 13.2–17.1)
Lymphs Abs: 1575 cells/uL (ref 850–3900)
MCH: 30.7 pg (ref 27.0–33.0)
MCHC: 33.3 g/dL (ref 32.0–36.0)
MCV: 92.2 fL (ref 80.0–100.0)
MPV: 10.1 fL (ref 7.5–12.5)
Monocytes Relative: 11.2 %
Neutro Abs: 3767 cells/uL (ref 1500–7800)
Neutrophils Relative %: 59.8 %
Platelets: 219 10*3/uL (ref 140–400)
RBC: 4.89 10*6/uL (ref 4.20–5.80)
RDW: 12.7 % (ref 11.0–15.0)
Total Lymphocyte: 25 %
WBC: 6.3 10*3/uL (ref 3.8–10.8)

## 2021-09-29 LAB — COMPLETE METABOLIC PANEL WITH GFR
AG Ratio: 1.7 (calc) (ref 1.0–2.5)
ALT: 24 U/L (ref 9–46)
AST: 25 U/L (ref 10–35)
Albumin: 4.3 g/dL (ref 3.6–5.1)
Alkaline phosphatase (APISO): 79 U/L (ref 35–144)
BUN: 19 mg/dL (ref 7–25)
CO2: 32 mmol/L (ref 20–32)
Calcium: 9.7 mg/dL (ref 8.6–10.3)
Chloride: 102 mmol/L (ref 98–110)
Creat: 1.25 mg/dL (ref 0.70–1.28)
Globulin: 2.5 g/dL (calc) (ref 1.9–3.7)
Glucose, Bld: 71 mg/dL (ref 65–99)
Potassium: 4.3 mmol/L (ref 3.5–5.3)
Sodium: 141 mmol/L (ref 135–146)
Total Bilirubin: 0.6 mg/dL (ref 0.2–1.2)
Total Protein: 6.8 g/dL (ref 6.1–8.1)
eGFR: 59 mL/min/{1.73_m2} — ABNORMAL LOW (ref >=60)

## 2021-09-29 LAB — HEMOGLOBIN A1C
Hgb A1c MFr Bld: 5.2 % of total Hgb (ref <5.7)
Mean Plasma Glucose: 103 mg/dL
eAG (mmol/L): 5.7 mmol/L

## 2022-02-21 ENCOUNTER — Other Ambulatory Visit: Payer: Self-pay | Admitting: Internal Medicine

## 2022-02-21 DIAGNOSIS — N138 Other obstructive and reflux uropathy: Secondary | ICD-10-CM

## 2022-02-21 MED ORDER — FINASTERIDE 5 MG PO TABS: ORAL_TABLET | ORAL | 3 refills | Status: DC

## 2022-02-24 ENCOUNTER — Other Ambulatory Visit: Payer: Self-pay | Admitting: Internal Medicine

## 2022-02-24 ENCOUNTER — Encounter: Payer: Self-pay | Admitting: Internal Medicine

## 2022-02-24 DIAGNOSIS — N401 Enlarged prostate with lower urinary tract symptoms: Secondary | ICD-10-CM

## 2022-02-24 MED ORDER — FINASTERIDE 5 MG PO TABS: ORAL_TABLET | ORAL | 0 refills | Status: DC

## 2022-03-15 ENCOUNTER — Encounter: Payer: Self-pay | Admitting: Internal Medicine

## 2022-03-15 ENCOUNTER — Ambulatory Visit (INDEPENDENT_AMBULATORY_CARE_PROVIDER_SITE_OTHER): Payer: PPO | Admitting: Internal Medicine

## 2022-03-15 VITALS — BP 128/70 | HR 71 | Temp 97.9°F | Resp 16 | Ht 70.0 in | Wt 193.6 lb

## 2022-03-15 DIAGNOSIS — Z0001 Encounter for general adult medical examination with abnormal findings: Secondary | ICD-10-CM

## 2022-03-15 DIAGNOSIS — E559 Vitamin D deficiency, unspecified: Secondary | ICD-10-CM | POA: Diagnosis not present

## 2022-03-15 DIAGNOSIS — E782 Mixed hyperlipidemia: Secondary | ICD-10-CM

## 2022-03-15 DIAGNOSIS — Z79899 Other long term (current) drug therapy: Secondary | ICD-10-CM

## 2022-03-15 DIAGNOSIS — N401 Enlarged prostate with lower urinary tract symptoms: Secondary | ICD-10-CM | POA: Diagnosis not present

## 2022-03-15 DIAGNOSIS — I10 Essential (primary) hypertension: Secondary | ICD-10-CM | POA: Diagnosis not present

## 2022-03-15 DIAGNOSIS — Z87891 Personal history of nicotine dependence: Secondary | ICD-10-CM

## 2022-03-15 DIAGNOSIS — Z1211 Encounter for screening for malignant neoplasm of colon: Secondary | ICD-10-CM

## 2022-03-15 DIAGNOSIS — R7309 Other abnormal glucose: Secondary | ICD-10-CM

## 2022-03-15 DIAGNOSIS — Z125 Encounter for screening for malignant neoplasm of prostate: Secondary | ICD-10-CM

## 2022-03-15 DIAGNOSIS — Z136 Encounter for screening for cardiovascular disorders: Secondary | ICD-10-CM

## 2022-03-15 DIAGNOSIS — I700 Atherosclerosis of aorta: Secondary | ICD-10-CM

## 2022-03-15 DIAGNOSIS — N138 Other obstructive and reflux uropathy: Secondary | ICD-10-CM | POA: Diagnosis not present

## 2022-03-15 DIAGNOSIS — Z0000 Encounter for general adult medical examination without abnormal findings: Secondary | ICD-10-CM

## 2022-03-15 NOTE — Patient Instructions (Signed)

## 2022-03-15 NOTE — Progress Notes (Signed)
?[facility] INTERNAL [facility]IC[facility]  ?Unk Pinto, M.D.   Marta Lamas, D.NP  Darrol Jump, D.NP ? ?[facility] ?[place], [place]. [address] ?Telephone [phone] ?Telefax [phone] ? ?Annual  Screening/Preventative Visit &  ?Comprehensive Evaluation & Examination ? ?Future Appointments  ?Date Time Provider Department  ?03/15/2022              CPE     3:00 PM Unk Pinto, MD [facility]  ?09/27/2022          Wellness 11:00 AM Magda Bernheim, NP [facility]  ?03/21/2023             CPE  3:00 PM Unk Pinto, MD [facility]  ? ? ?     This very nice 76 y.o. MWM  presents for a Screening /Preventative Visit & comprehensive evaluation and management of multiple medical co-morbidities.  Patient has been followed for HTN, HLD, Prediabetes and Vitamin D Deficiency. ? ? ?    HTN predates circa 1996. Patient's BP has been controlled at home.  Today's BP is at goal - 128/70. Patient denies any cardiac symptoms as chest pain, palpitations, shortness of breath, dizziness or ankle swelling. Wife relates patient only taking 1/2 tab of the HCTZ 12.5 - probably a sub-therapeutic dose.  ? ? ?    Patient's hyperlipidemia is controlled with diet and Simvastatin. Patient denies myalgias or other medication SE's. Last lipids were at goal : ? ?Lab Results  ?Component Value Date  ? CHOL 134 09/28/2021  ? HDL 58 09/28/2021  ? [address] 60 09/28/2021  ? TRIG 82 09/28/2021  ? CHOLHDL 2.3 09/28/2021  ? ? ? ?    Patient has hx/o prediabetes (A1c 5.9% /2010) and patient denies reactive hypoglycemic symptoms, visual blurring, diabetic polys or paresthesias. Last A1c was normal & at goal : ?  ?Lab Results  ?Component Value Date  ? HGBA1C 5.2 09/28/2021  ?  ? ? ?    Finally, patient has history of Vitamin D Deficiency ("41" /2008) and last vitamin D was at goal : ?  ?Lab Results  ?Component Value Date  ? VD25OH 89 03/11/2021  ? ? ? ?Current Outpatient Medications on File Prior  to Visit  ?Medication Sig  ? VITAMIN C Take 1 tablet  daily.  ? aspirin 81 MG tablet Take  daily.  ? atenolol 25 MG tablet Takes 1 tablet daily for BP  ? VITAMIN D  Take 2,000 Units  2  times daily.   ? finasteride 5 MG tablet Take  1 tablet  Daily  for Prostate  ? Hydrochlorothiazide 12.5 MG tablet Take 1 tablet Daily  ? Omega-3 FISH OIL Take  daily.  ? simvastatin  40 MG tablet Take  1 tablet  at Bedtime  for Cholesterol  ? tamsulosin 0.4 MG CAPS  Take 1 tablet  at Bedtime  for Prostate  ? zinc 50 MG tablet Take daily.  ? ? ?No Known Allergies ? ? ?Past Medical History:  ?Diagnosis Date  ? BPH (benign prostatic hyperplasia)   ? GERD (gastroesophageal reflux disease)   ? Hyperlipidemia   ? Hypertension   ? Prediabetes   ? Vitamin D deficiency   ? ? ? ?Health Maintenance  ?Topic Date Due  ? COVID-19 Vaccine (1) Never done  ? Zoster Vaccines- Shingrix (1 of 2) Never done  ? INFLUENZA VACCINE  06/08/2022  ? TETANUS/TDAP  04/21/2028  ? Pneumonia Vaccine 35+ Years old  Completed  ?  Hepatitis C Screening  Completed  ? HPV VACCINES  Aged Out  ? ? ? ?Immunization History  ?Administered Date(s) Administered  ? Influenza, High Dose 08/21/2014, 09/09/2015, 07/06/2016, 08/25/2020  ? Influenza 08/08/2018, 08/16/2019, 08/29/2021  ? Pneumococcal -13 01/30/2019  ? Pneumococcal -23 06/05/2009  ? Td 01/26/2007, 04/21/2018  ? ? ?Last Colon -  10/10/2018 - Dr Romilda Garret - recc 5 yr f/u - due Dec 2024 ? ? ?Past Surgical History:  ?Procedure Laterality Date  ? CEREBRAL ANEURYSM REPAIR  1966  ? ? ? ?Family History  ?Problem Relation Age of Onset  ? Alzheimer's disease Mother   ? Cancer Father   ?     colon  ? Diabetes Sister   ? ? ? ?Social History  ? ? Marital status: Married  ? Number of children: 1 son & 1 daughter & 7 GC    ?  ?Occupational History  ? 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 ?Tobacco Use  ? Smoking status: Former  ?  Types: Cigarettes  ?  Quit date: 04/30/1974  ?  Years since quitting: 47.9  ?  Smokeless tobacco: Never  ?Substance Use Topics  ? Alcohol use: No  ? Drug use: No  ? ? ? ? ROS ?Constitutional: Denies fever, chills, weight loss/gain, headaches, insomnia,  night sweats or change in appetite. Does c/o fatigue. ?Eyes: Denies redness, blurred vision, diplopia, discharge, itchy or watery eyes.  ?ENT: Denies discharge, congestion, post nasal drip, epistaxis, sore throat, earache, hearing loss, dental pain, Tinnitus, Vertigo, Sinus pain or snoring.  ?Cardio: Denies chest pain, palpitations, irregular heartbeat, syncope, dyspnea, diaphoresis, orthopnea, PND, claudication or edema ?Respiratory: denies cough, dyspnea, DOE, pleurisy, hoarseness, laryngitis or wheezing.  ?Gastrointestinal: Denies dysphagia, heartburn, reflux, water brash, pain, cramps, nausea, vomiting, bloating, diarrhea, constipation, hematemesis, melena, hematochezia, jaundice or hemorrhoids ?Genitourinary: Denies dysuria, frequency, discharge, hematuria or flank pain. Has urgency, nocturia x 0-3 & occasional hesitancy. ?Musculoskeletal: Denies arthralgia, myalgia, stiffness, Jt. Swelling, pain, limp or strain/sprain. Denies Falls. ?Skin: Denies puritis, rash, hives, warts, acne, eczema or change in skin lesion ?Neuro: No weakness, tremor, incoordination, spasms, paresthesia or pain ?Psychiatric: Denies confusion, memory loss or sensory loss. Denies Depression. ?Endocrine: Denies change in weight, skin, hair change, nocturia, and paresthesia, diabetic polys, visual blurring or hyper / hypo glycemic episodes.  ?Heme/Lymph: No excessive bleeding, bruising or enlarged lymph nodes. ? ? ?Physical Exam ? ?BP 128/70   Pulse 71   Temp 97.9 ?F (36.6 ?C)   Resp 16   Ht '5\' 10"'$  (1.778 m)   Wt 193 lb 9.6 oz (87.8 kg)   SpO2 96%   BMI 27.78 kg/m?  ? ?General Appearance: Well nourished and well groomed and in no apparent distress. ? ?Eyes: PERRLA, EOMs, conjunctiva no swelling or erythema, normal fundi and vessels. ?Sinuses: No frontal/maxillary  tenderness ?ENT/Mouth: EACs patent / TMs  nl. Nares clear without erythema, swelling, mucoid exudates. Oral hygiene is good. No erythema, swelling, or exudate. Tongue normal, non-obstructing. Tonsils not swollen or erythematous. Hearing normal.  ?Neck: Supple, thyroid not palpable. No bruits, nodes or JVD. ?Respiratory: Respiratory effort normal.  BS equal and clear bilateral without rales, rhonci, wheezing or stridor. ?Cardio: Heart sounds are normal with regular rate and rhythm and no murmurs, rubs or gallops. Peripheral pulses are normal and equal bilaterally without edema. No aortic or femoral bruits. ?Chest: symmetric with normal excursions and percussion.  ?Abdomen: Soft, with Nl bowel sounds. Nontender, no guarding, rebound, hernias,  masses, or organomegaly.  ?Lymphatics: Non tender without lymphadenopathy.  ?Musculoskeletal: Full ROM all peripheral extremities, joint stability, 5/5 strength, and normal gait. ?Skin: Warm and dry without rashes, lesions, cyanosis, clubbing or  ecchymosis.  ?Neuro: Cranial nerves intact, reflexes equal bilaterally. Normal muscle tone, no cerebellar symptoms. Sensation intact.  ?Pysch: Alert and oriented X 3 with normal affect, insight and judgment appropriate.  ? ?Assessment and Plan ? ?1. Annual Preventative/Screening Exam  ? ? ?2. Essential hypertension ? ?- D/C Diuretic  & monitor BP's & for edema. ? ?- EKG 12-Lead ?- [place], RETROPERITNL ABD,  LTD ?- Urinalysis, Routine w reflex microscopic ?- Microalbumin / creatinine urine ratio ?- CBC with Differential/Platelet ?- COMPLETE METABOLIC PANEL WITH GFR ?- Magnesium ?- TSH ? ?3. Hyperlipidemia, mixed ? ?- EKG 12-Lead ?- [place], RETROPERITNL ABD,  LTD ?- Lipid panel ?- TSH ? ?4. Abnormal glucose ? ?- EKG 12-Lead ?- [place], RETROPERITNL ABD,  LTD ?- Hemoglobin A1c ?- Insulin, random ? ?5. Vitamin D deficiency ? ?- VITAMIN D 25 Hydroxy  ? ?6. BPH with obstruction/lower urinary tract symptoms ? ?- PSA ? ?7. Screening for colorectal  cancer ? ?- POC Hemoccult Bld/Stl ( ? ?8. Prostate cancer screening ? ?- PSA ? ?9. Screening for heart disease ? ?- EKG 12-Lead ? ?10. Former smoker ? ?- EKG 12-Lead ?- [place], RETROPERITNL ABD,  LTD ? ?11. Screening for A

## 2022-03-16 DIAGNOSIS — L814 Other melanin hyperpigmentation: Secondary | ICD-10-CM | POA: Diagnosis not present

## 2022-03-16 DIAGNOSIS — H02831 Dermatochalasis of right upper eyelid: Secondary | ICD-10-CM | POA: Diagnosis not present

## 2022-03-16 DIAGNOSIS — L570 Actinic keratosis: Secondary | ICD-10-CM | POA: Diagnosis not present

## 2022-03-16 DIAGNOSIS — H04123 Dry eye syndrome of bilateral lacrimal glands: Secondary | ICD-10-CM | POA: Diagnosis not present

## 2022-03-16 DIAGNOSIS — H25813 Combined forms of age-related cataract, bilateral: Secondary | ICD-10-CM | POA: Diagnosis not present

## 2022-03-16 DIAGNOSIS — L821 Other seborrheic keratosis: Secondary | ICD-10-CM | POA: Diagnosis not present

## 2022-03-16 DIAGNOSIS — H02834 Dermatochalasis of left upper eyelid: Secondary | ICD-10-CM | POA: Diagnosis not present

## 2022-03-16 DIAGNOSIS — D2262 Melanocytic nevi of left upper limb, including shoulder: Secondary | ICD-10-CM | POA: Diagnosis not present

## 2022-03-16 NOTE — Progress Notes (Signed)
<><><><><><><><><><><><><><><><><><><><><><><><><><><><><><><><><> ?<><><><><><><><><><><><><><><><><><><><><><><><><><><><><><><><><> ?-   Test results slightly outside the reference range are not unusual. ?If there is anything important, I will review this with you,  ?otherwise it is considered normal test values.  ?If you have further questions,  ?please do not hesitate to contact me at the office or via My Chart.  ?<><><><><><><><><><><><><><><><><><><><><><><><><><><><><><><><><> ?<><><><><><><><><><><><><><><><><><><><><><><><><><><><><><><><><> ? ?-  Kidney functions Stable ?<><><><><><><><><><><><><><><><><><><><><><><><><><><><><><><><><> ? ?-  Insulin level = 111.4 is high  (normal is less than 20 ) and shows insulin resistance  ? ?- a sign of early diabetes and associated with a 300 % greater risk for heart attacks, strokes, cancer & Alzheimer type vascular dementia  ? ?- All this can be cured  and prevented with losing weigh ? ? - get Dr Fara Olden Fuhrman's book 'the End of Diabetes" and "the End of Dieting"  ? ? ?- Avoid Sweets, Candy & White Stuff  ? ?- White Rice, White Potatoes, White Flour  - Breads &  Pasta ?<><><><><><><><><><><><><><><><><><><><><><><><><><><><><><><><><> ? ?-  Total   Chol = 134   & Bad LDL Chol = 57 -  Excellent  ? ?- Very low risk for Heart Attack  / Stroke ?<><><><><><><><><><><><><><><><><><><><><><><><><><><><><><><><><> ? ?-   A1c = 5.3% - So definitely not Diabetic yet.  ?<><><><><><><><><><><><><><><><><><><><><><><><><><><><><><><><><> ? ?-  Vitamin D = 74 - Excellent - Please keep dose same  ?<><><><><><><><><><><><><><><><><><><><><><><><><><><><><><><><><> ? ?-  All Else - CBC - Kidneys - Electrolytes - Liver - Magnesium & Thyroid   ? ?- all  Normal / OK ?<><><><><><><><><><><><><><><><><><><><><><><><><><><><><><><><><> ? ?-  Keep up the [place] Work     ! ?<><><><><><><><><><><><><><><><><><><><><><><><><><><><><><><><><> ?<><><><><><><><><><><><><><><><><><><><><><><><><><><><><><><><><> ? ? ? ? ? ? ? ? ? ? ? ? ? ? ? ? ? ? ? ? ? ? ? ? ? ?

## 2022-03-17 LAB — COMPLETE METABOLIC PANEL WITH GFR
AG Ratio: 1.8 (calc) (ref 1.0–2.5)
ALT: 17 U/L (ref 9–46)
AST: 20 U/L (ref 10–35)
Albumin: 4.2 g/dL (ref 3.6–5.1)
Alkaline phosphatase (APISO): 78 U/L (ref 35–144)
BUN: 21 mg/dL (ref 7–25)
CO2: 31 mmol/L (ref 20–32)
Calcium: 9.3 mg/dL (ref 8.6–10.3)
Chloride: 106 mmol/L (ref 98–110)
Creat: 1.25 mg/dL (ref 0.70–1.28)
Globulin: 2.3 g/dL (calc) (ref 1.9–3.7)
Glucose, Bld: 118 mg/dL — ABNORMAL HIGH (ref 65–99)
Potassium: 3.7 mmol/L (ref 3.5–5.3)
Sodium: 144 mmol/L (ref 135–146)
Total Bilirubin: 0.6 mg/dL (ref 0.2–1.2)
Total Protein: 6.5 g/dL (ref 6.1–8.1)
eGFR: 59 mL/min/{1.73_m2} — ABNORMAL LOW (ref >=60)

## 2022-03-17 LAB — LIPID PANEL
Cholesterol: 134 mg/dL (ref <200)
HDL: 56 mg/dL (ref >=40)
LDL Cholesterol (Calc): 57 mg/dL (calc)
Non-HDL Cholesterol (Calc): 78 mg/dL (calc) (ref <130)
Total CHOL/HDL Ratio: 2.4 (calc) (ref <5.0)
Triglycerides: 130 mg/dL (ref <150)

## 2022-03-17 LAB — URINALYSIS, ROUTINE W REFLEX MICROSCOPIC
Bilirubin Urine: NEGATIVE
Glucose, UA: NEGATIVE
Hgb urine dipstick: NEGATIVE
Ketones, ur: NEGATIVE
Leukocytes,Ua: NEGATIVE
Nitrite: NEGATIVE
Protein, ur: NEGATIVE
Specific Gravity, Urine: 1.022 (ref 1.001–1.035)
pH: <=5 (ref 5.0–8.0)

## 2022-03-17 LAB — CBC WITH DIFFERENTIAL/PLATELET
Absolute Monocytes: 460 cells/uL (ref 200–950)
Basophils Absolute: 50 cells/uL (ref 0–200)
Basophils Relative: 0.8 %
Eosinophils Absolute: 183 cells/uL (ref 15–500)
Eosinophils Relative: 2.9 %
HCT: 43.3 % (ref 38.5–50.0)
Hemoglobin: 14.4 g/dL (ref 13.2–17.1)
Lymphs Abs: 1890 cells/uL (ref 850–3900)
MCH: 30.3 pg (ref 27.0–33.0)
MCHC: 33.3 g/dL (ref 32.0–36.0)
MCV: 91 fL (ref 80.0–100.0)
MPV: 10 fL (ref 7.5–12.5)
Monocytes Relative: 7.3 %
Neutro Abs: 3717 cells/uL (ref 1500–7800)
Neutrophils Relative %: 59 %
Platelets: 209 10*3/uL (ref 140–400)
RBC: 4.76 10*6/uL (ref 4.20–5.80)
RDW: 12.9 % (ref 11.0–15.0)
Total Lymphocyte: 30 %
WBC: 6.3 10*3/uL (ref 3.8–10.8)

## 2022-03-17 LAB — MAGNESIUM: Magnesium: 2.1 mg/dL (ref 1.5–2.5)

## 2022-03-17 LAB — INSULIN, RANDOM: Insulin: 111.4 u[IU]/mL — ABNORMAL HIGH

## 2022-03-17 LAB — PSA: PSA: 0.38 ng/mL (ref <=4.00)

## 2022-03-17 LAB — HEMOGLOBIN A1C
Hgb A1c MFr Bld: 5.3 % of total Hgb (ref <5.7)
Mean Plasma Glucose: 105 mg/dL
eAG (mmol/L): 5.8 mmol/L

## 2022-03-17 LAB — MICROALBUMIN / CREATININE URINE RATIO
Creatinine, Urine: 138 mg/dL (ref 20–320)
Microalb Creat Ratio: 1 mcg/mg creat (ref <30)
Microalb, Ur: 0.2 mg/dL

## 2022-03-17 LAB — VITAMIN D 25 HYDROXY (VIT D DEFICIENCY, FRACTURES): Vit D, 25-Hydroxy: 74 ng/mL (ref 30–100)

## 2022-03-17 LAB — TSH: TSH: 3.17 mIU/L (ref 0.40–4.50)

## 2022-04-01 ENCOUNTER — Other Ambulatory Visit: Payer: Self-pay

## 2022-04-01 DIAGNOSIS — Z1212 Encounter for screening for malignant neoplasm of rectum: Secondary | ICD-10-CM | POA: Diagnosis not present

## 2022-04-01 DIAGNOSIS — Z1211 Encounter for screening for malignant neoplasm of colon: Secondary | ICD-10-CM | POA: Diagnosis not present

## 2022-04-01 LAB — POC HEMOCCULT BLD/STL (HOME/3-CARD/SCREEN)
Card #2 Fecal Occult Blod, POC: NEGATIVE
Card #3 Fecal Occult Blood, POC: NEGATIVE
Fecal Occult Blood, POC: NEGATIVE

## 2022-04-29 ENCOUNTER — Other Ambulatory Visit: Payer: Self-pay

## 2022-04-29 DIAGNOSIS — N401 Enlarged prostate with lower urinary tract symptoms: Secondary | ICD-10-CM

## 2022-04-29 MED ORDER — TAMSULOSIN HCL 0.4 MG PO CAPS: ORAL_CAPSULE | ORAL | 3 refills | Status: DC

## 2022-05-22 ENCOUNTER — Other Ambulatory Visit: Payer: Self-pay | Admitting: Internal Medicine

## 2022-05-22 DIAGNOSIS — N401 Enlarged prostate with lower urinary tract symptoms: Secondary | ICD-10-CM

## 2022-05-22 DIAGNOSIS — N138 Other obstructive and reflux uropathy: Secondary | ICD-10-CM

## 2022-06-21 ENCOUNTER — Ambulatory Visit: Payer: PPO | Admitting: Nurse Practitioner

## 2022-09-27 ENCOUNTER — Ambulatory Visit: Payer: PPO | Admitting: Nurse Practitioner

## 2022-09-28 ENCOUNTER — Ambulatory Visit: Payer: PPO | Admitting: Internal Medicine

## 2022-09-28 NOTE — Progress Notes (Unsigned)
MEDICARE ANNUAL WELLNESS VISIT AND FOLLOW UP Assessment:   Diagnoses and all orders for this visit:  Encounter for Medicare annual wellness exam Due yearly  Essential hypertension Continue medication Monitor blood pressure at home; call if consistently over 130/80 Continue DASH diet.   Reminder to go to the ER if any CP, SOB, nausea, dizziness, severe HA, changes vision/speech, left arm numbness and tingling and jaw pain. CBC  Gastroesophageal reflux disease, esophagitis presence not specified Well managed by lifestyle  Discussed diet, avoiding triggers and other lifestyle changes  Benign prostatic hyperplasia, unspecified whether lower urinary tract symptoms present Currently off of medications and asymptomatic  Vitamin D deficiency At goal at recent check; continue to recommend supplementation for goal of 60-100 Defer vitamin D level  Stage 3a CKD(HCC) Increase fluids, avoid NSAIDS, monitor sugars, will monitor -CMP   Other abnormal glucose Recent A1Cs at goal Discussed diet/exercise, weight management  A1C; check CMP  Mixed hyperlipidemia Continue medications Continue low cholesterol diet and exercise.  Check lipid panel.  CMP  Overweight (BMI 25.0-29.9) Long discussion about weight loss, diet, and exercise Recommended diet heavy in fruits and veggies and low in animal meats, cheeses, and dairy products, appropriate calorie intake Discussed appropriate weight for height  Start exercising - 150 min/week Follow up at next visit  History of cerebral aneurysm repair Stable since 1996; control blood pressure  Medication Management Magnesium   Over 30 minutes of exam, counseling, chart review, and critical decision making was performed  Future Appointments  Date Time Provider [place]  03/21/2023  3:00 PM Brendan Pinto, MD [facility] None  07/05/2023  4:00 PM Alycia Rossetti, NP [facility] None     Plan:   During the course of the visit the  patient was educated and counseled about appropriate screening and preventive services including:   Pneumococcal vaccine  Influenza vaccine Prevnar 13 Td vaccine Screening electrocardiogram Colorectal cancer screening Diabetes screening Glaucoma screening Nutrition counseling    Subjective:  Brendan Monroe. is a 76 y.o. male who presents for Medicare Annual Wellness Visit and 3 month follow up for HTN, hyperlipidemia, glucose management, and vitamin D Def.   He has hx of cerebral aneurysm repair in 1996 after which he has some mild short term memory loss which has been stable.  he has a diagnosis of GERD which is currently managed by lifestyle, no problems in several years.   BMI is Body mass index is 26.98 kg/m., he has been working on diet- has cut out junk food. Doing more walking He does go fishing regularly at the beach with some walking, at least 3 days a week.  Wt Readings from Last 3 Encounters:  09/29/22 188 lb (85.3 kg)  03/15/22 193 lb 9.6 oz (87.8 kg)  09/28/21 198 lb 6.4 oz (90 kg)   His blood pressure has been controlled at home, today their BP is BP: 116/64  BP Readings from Last 3 Encounters:  09/29/22 116/64  03/15/22 128/70  09/28/21 120/74  He does workout. He denies chest pain, shortness of breath, dizziness.   He is on cholesterol medication (simvastatin 40 mg daily) and denies myalgias. His cholesterol is at goal. The cholesterol last visit was:   Lab Results  Component Value Date   CHOL 134 03/15/2022   HDL 56 03/15/2022   LDLCALC 57 03/15/2022   TRIG 130 03/15/2022   CHOLHDL 2.4 03/15/2022   He has been working on diet and exercise for glucose managment, and denies foot  ulcerations, increased appetite, nausea, paresthesia of the feet, polydipsia, polyuria, visual disturbances, vomiting and weight loss. Last A1C in the office was:  Lab Results  Component Value Date   HGBA1C 5.3 03/15/2022   Has been trying to drink 64 ounces of water daily.  Last GFR Lab Results  Component Value Date   EGFR 59 (L) 03/15/2022     Patient is on Vitamin D supplement.   Lab Results  Component Value Date   VD25OH 83 67/20/9470     He is uncertain if he has ever had chickenpox and has been exposed.  Medication Review:   Current Outpatient Medications (Cardiovascular):    atenolol (TENORMIN) 25 MG tablet, Takes 1 tablet daily for BP   hydrochlorothiazide (HYDRODIURIL) 12.5 MG tablet, Take 1 tablet Daily for BP & Fluid Retention /Ankle Swelling   simvastatin (ZOCOR) 40 MG tablet, Take  1 tablet  at Bedtime  for Cholesterol   Current Outpatient Medications (Analgesics):    aspirin 81 MG tablet, Take 81 mg by mouth daily.   Current Outpatient Medications (Other):    Ascorbic Acid (VITAMIN C PO), Take 1 tablet by mouth daily.   B Complex-Biotin-FA (B-COMPLEX PO), Take by mouth.   Cholecalciferol (VITAMIN D PO), Take 2,000 Units by mouth 2 (two) times daily.    finasteride (PROSCAR) 5 MG tablet, TAKE ONE TABLET BY MOUTH DAILY FOR PROSTATE   Omega-3 Fatty Acids (FISH OIL PO), Take by mouth daily.   tamsulosin (FLOMAX) 0.4 MG CAPS capsule, Take one tablet at bedtime for Prostate.   zinc gluconate 50 MG tablet, Take 50 mg by mouth daily.  Allergies: No Known Allergies  Current Problems (verified) has Other abnormal glucose; Hyperlipidemia; Hypertension; GERD (gastroesophageal reflux disease); BPH (benign prostatic hyperplasia); Vitamin D deficiency; Medication management; Overweight (BMI 25.0-29.9); and History of cerebral aneurysm repair on their problem list.  Screening Tests Immunization History  Administered Date(s) Administered   Influenza, High Dose Seasonal PF 08/21/2014, 09/09/2015, 07/06/2016, 08/25/2020   Influenza-Unspecified 08/08/2018, 08/16/2019, 08/29/2021, 09/08/2022   Pneumococcal Conjugate-13 01/30/2019   Pneumococcal Polysaccharide-23 06/05/2009   Td 01/26/2007, 04/21/2018   Preventative care: Last colonoscopy:  10/2018 - Dr. Cristina Gong due 2024  Prior vaccinations: TD or Tdap: 2019 Influenza: 08/25/21  Pneumococcal: 2010 Prevnar13: 2020 Shingles/Zostavax: Declines Covid 19: declines   Names of Other Physician/Practitioners you currently use: 1. [facility] here for primary care 2. Dr. Katy Fitch , eye doctor, last visit 2022 3. Dr. Deboraha Sprang, dentist, last visit 2022, goes q 6 months  Patient Care Team: Brendan Pinto, MD as PCP - General (Internal Medicine) Inda Castle, MD (Inactive) as Consulting Physician (Gastroenterology)  Surgical: He  has a past surgical history that includes Cerebral aneurysm repair (1966). Family His family history includes Alzheimer's disease in his mother; Cancer in his father; Diabetes in his sister. Social history  He reports that he quit smoking about 48 years ago. His smoking use included cigarettes. He has never used smokeless tobacco. He reports that he does not drink alcohol and does not use drugs.  MEDICARE WELLNESS OBJECTIVES: Physical activity: Current Exercise Habits: The patient does not participate in regular exercise at present, Exercise limited by: None identified Cardiac risk factors: Cardiac Risk Factors include: advanced age (>70mn, >>69women);dyslipidemia;hypertension;male gender;smoking/ tobacco exposure;sedentary lifestyle Depression/mood screen:      09/29/2022    4:20 PM  Depression screen PHQ 2/9  Decreased Interest 0  Down, Depressed, Hopeless 0  PHQ - 2 Score 0  ADLs:     09/29/2022    4:20 PM  In your present state of health, do you have any difficulty performing the following activities:  Hearing? 0  Vision? 0  Difficulty concentrating or making decisions? 1  Walking or climbing stairs? 0  Dressing or bathing? 0  Doing errands, shopping? 1      Cognitive Testing  Alert? Yes  Normal Appearance?Yes  Oriented to person? Yes  Place? Yes   Time? Yes  Recall of three  objects?  1/3  Can perform simple calculations? Yes  Displays appropriate judgment?Yes  Can read the correct time from a watch face?Yes  EOL planning: Does Patient Have a Medical Advance Directive?: Yes Type of Advance Directive: Healthcare Power of Attorney, Living will Does patient want to make changes to medical advance directive?: No - Patient declined Copy of [place] in Chart?: No - copy requested   Objective:   Today's Vitals   09/29/22 1538  BP: 116/64  Pulse: 66  Temp: 97.7 F (36.5 C)  SpO2: 98%  Weight: 188 lb (85.3 kg)  Height: _0  (1.778 m)     Body mass index is 26.98 kg/m.  General appearance: alert, no distress, WD/WN, male HEENT: normocephalic, sclerae anicteric, TMs pearly, nares patent, no discharge or erythema, pharynx normal Oral cavity: MMM, no lesions Neck: supple, no lymphadenopathy, no thyromegaly, no masses Heart: RRR, normal S1, S2, no murmurs Lungs: CTA bilaterally, no wheezes, rhonchi, or rales Abdomen: +bs, soft, non tender, non distended, no masses, no hepatomegaly, no splenomegaly Musculoskeletal: nontender, no swelling, no obvious deformity Extremities: no edema, no cyanosis, no clubbing Pulses: 2+ symmetric, upper and lower extremities, normal cap refill Neurological: alert, oriented x 3, CN2-12 intact, strength normal upper extremities and lower extremities, sensation normal throughout, DTRs 2+ throughout, no cerebellar signs, gait normal Psychiatric: normal affect, behavior normal, pleasant  MMSE 23/30 memory has been altered  since in his 20's  Medicare Attestation I have personally reviewed: The patient's medical and social history Their use of alcohol, tobacco or illicit drugs Their current medications and supplements The patient's functional ability including ADLs,fall risks, home safety risks, cognitive, and hearing and visual impairment Diet and physical activities Evidence for depression or mood  disorders  The patient's weight, height, BMI, and visual acuity have been recorded in the chart.  I have made referrals, counseling, and provided education to the patient based on review of the above and I have provided the patient with a written personalized care plan for preventive services.     Alycia Rossetti, NP   09/29/2022

## 2022-09-29 ENCOUNTER — Encounter: Payer: Self-pay | Admitting: Nurse Practitioner

## 2022-09-29 ENCOUNTER — Ambulatory Visit (INDEPENDENT_AMBULATORY_CARE_PROVIDER_SITE_OTHER): Payer: PPO | Admitting: Nurse Practitioner

## 2022-09-29 VITALS — BP 116/64 | HR 66 | Temp 97.7°F | Ht 70.0 in | Wt 188.0 lb

## 2022-09-29 DIAGNOSIS — Z8679 Personal history of other diseases of the circulatory system: Secondary | ICD-10-CM | POA: Diagnosis not present

## 2022-09-29 DIAGNOSIS — Z87891 Personal history of nicotine dependence: Secondary | ICD-10-CM | POA: Diagnosis not present

## 2022-09-29 DIAGNOSIS — E559 Vitamin D deficiency, unspecified: Secondary | ICD-10-CM

## 2022-09-29 DIAGNOSIS — N138 Other obstructive and reflux uropathy: Secondary | ICD-10-CM

## 2022-09-29 DIAGNOSIS — N401 Enlarged prostate with lower urinary tract symptoms: Secondary | ICD-10-CM | POA: Diagnosis not present

## 2022-09-29 DIAGNOSIS — N1831 Chronic kidney disease, stage 3a: Secondary | ICD-10-CM

## 2022-09-29 DIAGNOSIS — R7309 Other abnormal glucose: Secondary | ICD-10-CM | POA: Diagnosis not present

## 2022-09-29 DIAGNOSIS — I10 Essential (primary) hypertension: Secondary | ICD-10-CM

## 2022-09-29 DIAGNOSIS — Z9889 Other specified postprocedural states: Secondary | ICD-10-CM | POA: Diagnosis not present

## 2022-09-29 DIAGNOSIS — E782 Mixed hyperlipidemia: Secondary | ICD-10-CM | POA: Diagnosis not present

## 2022-09-29 DIAGNOSIS — Z79899 Other long term (current) drug therapy: Secondary | ICD-10-CM | POA: Diagnosis not present

## 2022-09-29 DIAGNOSIS — K219 Gastro-esophageal reflux disease without esophagitis: Secondary | ICD-10-CM | POA: Diagnosis not present

## 2022-09-29 DIAGNOSIS — E663 Overweight: Secondary | ICD-10-CM | POA: Diagnosis not present

## 2022-09-29 DIAGNOSIS — R6889 Other general symptoms and signs: Secondary | ICD-10-CM

## 2022-09-29 DIAGNOSIS — Z0001 Encounter for general adult medical examination with abnormal findings: Secondary | ICD-10-CM

## 2022-09-29 DIAGNOSIS — Z0000 Encounter for general adult medical examination without abnormal findings: Secondary | ICD-10-CM

## 2022-09-29 NOTE — Patient Instructions (Signed)

## 2022-09-30 LAB — CBC WITH DIFFERENTIAL/PLATELET
Absolute Monocytes: 701 cells/uL (ref 200–950)
Basophils Absolute: 50 cells/uL (ref 0–200)
Basophils Relative: 0.8 %
Eosinophils Absolute: 242 cells/uL (ref 15–500)
Eosinophils Relative: 3.9 %
HCT: 38.9 % (ref 38.5–50.0)
Hemoglobin: 13.4 g/dL (ref 13.2–17.1)
Lymphs Abs: 1792 cells/uL (ref 850–3900)
MCH: 30.9 pg (ref 27.0–33.0)
MCHC: 34.4 g/dL (ref 32.0–36.0)
MCV: 89.6 fL (ref 80.0–100.0)
MPV: 10.3 fL (ref 7.5–12.5)
Monocytes Relative: 11.3 %
Neutro Abs: 3416 cells/uL (ref 1500–7800)
Neutrophils Relative %: 55.1 %
Platelets: 200 10*3/uL (ref 140–400)
RBC: 4.34 10*6/uL (ref 4.20–5.80)
RDW: 12.5 % (ref 11.0–15.0)
Total Lymphocyte: 28.9 %
WBC: 6.2 10*3/uL (ref 3.8–10.8)

## 2022-09-30 LAB — LIPID PANEL
Cholesterol: 129 mg/dL (ref <200)
HDL: 52 mg/dL (ref >=40)
LDL Cholesterol (Calc): 59 mg/dL (calc)
Non-HDL Cholesterol (Calc): 77 mg/dL (calc) (ref <130)
Total CHOL/HDL Ratio: 2.5 (calc) (ref <5.0)
Triglycerides: 98 mg/dL (ref <150)

## 2022-09-30 LAB — COMPLETE METABOLIC PANEL WITH GFR
AG Ratio: 1.8 (calc) (ref 1.0–2.5)
ALT: 14 U/L (ref 9–46)
AST: 23 U/L (ref 10–35)
Albumin: 4.2 g/dL (ref 3.6–5.1)
Alkaline phosphatase (APISO): 80 U/L (ref 35–144)
BUN: 18 mg/dL (ref 7–25)
CO2: 31 mmol/L (ref 20–32)
Calcium: 9.6 mg/dL (ref 8.6–10.3)
Chloride: 103 mmol/L (ref 98–110)
Creat: 1.12 mg/dL (ref 0.70–1.28)
Globulin: 2.3 g/dL (calc) (ref 1.9–3.7)
Glucose, Bld: 118 mg/dL — ABNORMAL HIGH (ref 65–99)
Potassium: 4 mmol/L (ref 3.5–5.3)
Sodium: 141 mmol/L (ref 135–146)
Total Bilirubin: 0.5 mg/dL (ref 0.2–1.2)
Total Protein: 6.5 g/dL (ref 6.1–8.1)
eGFR: 67 mL/min/{1.73_m2} (ref >=60)

## 2022-09-30 LAB — HEMOGLOBIN A1C
Hgb A1c MFr Bld: 5.3 % of total Hgb (ref <5.7)
Mean Plasma Glucose: 105 mg/dL
eAG (mmol/L): 5.8 mmol/L

## 2022-09-30 LAB — MAGNESIUM: Magnesium: 2.2 mg/dL (ref 1.5–2.5)

## 2022-10-22 ENCOUNTER — Telehealth: Payer: Self-pay | Admitting: Nurse Practitioner

## 2022-10-22 ENCOUNTER — Encounter: Payer: Self-pay | Admitting: Nurse Practitioner

## 2022-10-22 NOTE — Progress Notes (Signed)
Assessment and Plan:  There are no diagnoses linked to this encounter.    Further disposition pending results of labs. Discussed med's effects and SE's.   Over 30 minutes of exam, counseling, chart review, and critical decision making was performed.   Future Appointments  Date Time Provider [place]  10/25/2022  3:00 PM Alycia Rossetti, NP [facility] None  03/21/2023  3:00 PM Unk Pinto, MD [facility] None  07/05/2023  4:00 PM Alycia Rossetti, NP [facility] None    ------------------------------------------------------------------------------------------------------------------   HPI There were no vitals taken for this visit. 76 y.o.male presents for  Past Medical History:  Diagnosis Date   BPH (benign prostatic hyperplasia)    GERD (gastroesophageal reflux disease)    Hyperlipidemia    Hypertension    Prediabetes    Vitamin D deficiency      No Known Allergies  Current Outpatient Medications on File Prior to Visit  Medication Sig   Ascorbic Acid (VITAMIN C PO) Take 1 tablet by mouth daily.   aspirin 81 MG tablet Take 81 mg by mouth daily.   atenolol (TENORMIN) 25 MG tablet Takes 1 tablet daily for BP   B Complex-Biotin-FA (B-COMPLEX PO) Take by mouth.   Cholecalciferol (VITAMIN D PO) Take 2,000 Units by mouth 2 (two) times daily.    finasteride (PROSCAR) 5 MG tablet TAKE ONE TABLET BY MOUTH DAILY FOR PROSTATE   hydrochlorothiazide (HYDRODIURIL) 12.5 MG tablet Take 1 tablet Daily for BP & Fluid Retention /Ankle Swelling   Omega-3 Fatty Acids (FISH OIL PO) Take by mouth daily.   simvastatin (ZOCOR) 40 MG tablet Take  1 tablet  at Bedtime  for Cholesterol   tamsulosin (FLOMAX) 0.4 MG CAPS capsule Take one tablet at bedtime for Prostate.   zinc gluconate 50 MG tablet Take 50 mg by mouth daily.   No current facility-administered medications on file prior to visit.    ROS: all negative except above.   Physical Exam:  There were no vitals taken for  this visit.  General Appearance: Well nourished, in no apparent distress. Eyes: PERRLA, EOMs, conjunctiva no swelling or erythema Sinuses: No Frontal/maxillary tenderness ENT/Mouth: Ext aud canals clear, TMs without erythema, bulging. No erythema, swelling, or exudate on post pharynx.  Tonsils not swollen or erythematous. Hearing normal.  Neck: Supple, thyroid normal.  Respiratory: Respiratory effort normal, BS equal bilaterally without rales, rhonchi, wheezing or stridor.  Cardio: RRR with no MRGs. Brisk peripheral pulses without edema.  Abdomen: Soft, + BS.  Non tender, no guarding, rebound, hernias, masses. Lymphatics: Non tender without lymphadenopathy.  Musculoskeletal: Full ROM, 5/5 strength, normal gait.  Skin: Warm, dry without rashes, lesions, ecchymosis.  Neuro: Cranial nerves intact. Normal muscle tone, no cerebellar symptoms. Sensation intact.  Psych: Awake and oriented X 3, normal affect, Insight and Judgment appropriate.     Alycia Rossetti, NP 12:00 PM [facility]

## 2022-10-22 NOTE — Telephone Encounter (Signed)
PT IS AT [facility] AT THE MOMENT AND HIS BP IS GOIN UP ON THE TOP PART LIKE THIS AM IT WAS 150 OVER 80 HE IS ON ATENOLOL BUT ONLY TAKING AN HALF OF A PILL SHOULD HE TAKE A WHOLE PILL? THE PHARM THERE IS [facility] [phone]. NANCY PHONE NUMBER IS [phone]. SHE COULD NOT GET ON MY CHART.

## 2022-10-22 NOTE — Telephone Encounter (Signed)
Please call pt, if this is only happening x 1 I would not increase the dosage of his Atenolol.  If it is consistently greater than 130/80 then we need to discuss changing medication

## 2022-10-25 ENCOUNTER — Ambulatory Visit (INDEPENDENT_AMBULATORY_CARE_PROVIDER_SITE_OTHER): Payer: PPO | Admitting: Nurse Practitioner

## 2022-10-25 ENCOUNTER — Encounter: Payer: Self-pay | Admitting: Nurse Practitioner

## 2022-10-25 VITALS — BP 132/58 | HR 77 | Temp 97.5°F | Ht 70.0 in | Wt 185.4 lb

## 2022-10-25 DIAGNOSIS — I10 Essential (primary) hypertension: Secondary | ICD-10-CM | POA: Diagnosis not present

## 2022-10-25 DIAGNOSIS — N1831 Chronic kidney disease, stage 3a: Secondary | ICD-10-CM

## 2022-10-25 NOTE — Patient Instructions (Signed)
Hypertension, Adult High blood pressure (hypertension) is when the force of blood pumping through the arteries is too strong. The arteries are the blood vessels that carry blood from the heart throughout the body. Hypertension forces the heart to work harder to pump blood and may cause arteries to become narrow or stiff. Untreated or uncontrolled hypertension can lead to a heart attack, heart failure, a stroke, kidney disease, and other problems. A blood pressure reading consists of a higher number over a lower number. Ideally, your blood pressure should be below 120/80. The first ("top") number is called the systolic pressure. It is a measure of the pressure in your arteries as your heart beats. The second ("bottom") number is called the diastolic pressure. It is a measure of the pressure in your arteries as the heart relaxes. What are the causes? The exact cause of this condition is not known. There are some conditions that result in high blood pressure. What increases the risk? Certain factors may make you more likely to develop high blood pressure. Some of these risk factors are under your control, including: Smoking. Not getting enough exercise or physical activity. Being overweight. Having too much fat, sugar, calories, or salt (sodium) in your diet. Drinking too much alcohol. Other risk factors include: Having a personal history of heart disease, diabetes, high cholesterol, or kidney disease. Stress. Having a family history of high blood pressure and high cholesterol. Having obstructive sleep apnea. Age. The risk increases with age. What are the signs or symptoms? High blood pressure may not cause symptoms. Very high blood pressure (hypertensive crisis) may cause: Headache. Fast or irregular heartbeats (palpitations). Shortness of breath. Nosebleed. Nausea and vomiting. Vision changes. Severe chest pain, dizziness, and seizures. How is this diagnosed? This condition is diagnosed by  measuring your blood pressure while you are seated, with your arm resting on a flat surface, your legs uncrossed, and your feet flat on the floor. The cuff of the blood pressure monitor will be placed directly against the skin of your upper arm at the level of your heart. Blood pressure should be measured at least twice using the same arm. Certain conditions can cause a difference in blood pressure between your right and left arms. If you have a high blood pressure reading during one visit or you have normal blood pressure with other risk factors, you may be asked to: Return on a different day to have your blood pressure checked again. Monitor your blood pressure at home for 1 week or longer. If you are diagnosed with hypertension, you may have other blood or imaging tests to help your health care provider understand your overall risk for other conditions. How is this treated? This condition is treated by making healthy lifestyle changes, such as eating healthy foods, exercising more, and reducing your alcohol intake. You may be referred for counseling on a healthy diet and physical activity. Your health care provider may prescribe medicine if lifestyle changes are not enough to get your blood pressure under control and if: Your systolic blood pressure is above 130. Your diastolic blood pressure is above 80. Your personal target blood pressure may vary depending on your medical conditions, your age, and other factors. Follow these instructions at home: Eating and drinking  Eat a diet that is high in fiber and potassium, and low in sodium, added sugar, and fat. An example of this eating plan is called the DASH diet. DASH stands for Dietary Approaches to Stop Hypertension. To eat this way: Eat   plenty of fresh fruits and vegetables. Try to fill one half of your plate at each meal with fruits and vegetables. Eat whole grains, such as whole-wheat pasta, brown rice, or whole-grain bread. Fill about one  fourth of your plate with whole grains. Eat or drink low-fat dairy products, such as skim milk or low-fat yogurt. Avoid fatty cuts of meat, processed or cured meats, and poultry with skin. Fill about one fourth of your plate with lean proteins, such as fish, chicken without skin, beans, eggs, or tofu. Avoid pre-made and processed foods. These tend to be higher in sodium, added sugar, and fat. Reduce your daily sodium intake. Many people with hypertension should eat less than 1,500 mg of sodium a day. Do not drink alcohol if: Your health care provider tells you not to drink. You are pregnant, may be pregnant, or are planning to become pregnant. If you drink alcohol: Limit how much you have to: 0-1 drink a day for women. 0-2 drinks a day for men. Know how much alcohol is in your drink. In the [place] one drink equals one 12 oz bottle of beer (355 mL), one 5 oz glass of wine (148 mL), or one 1 oz glass of hard liquor (44 mL). Lifestyle  Work with your health care provider to maintain a healthy body weight or to lose weight. Ask what an ideal weight is for you. Get at least 30 minutes of exercise that causes your heart to beat faster (aerobic exercise) most days of the week. Activities may include walking, swimming, or biking. Include exercise to strengthen your muscles (resistance exercise), such as Pilates or lifting weights, as part of your weekly exercise routine. Try to do these types of exercises for 30 minutes at least 3 days a week. Do not use any products that contain nicotine or tobacco. These products include cigarettes, chewing tobacco, and vaping devices, such as e-cigarettes. If you need help quitting, ask your health care provider. Monitor your blood pressure at home as told by your health care provider. Keep all follow-up visits. This is important. Medicines Take over-the-counter and prescription medicines only as told by your health care provider. Follow directions carefully. Blood  pressure medicines must be taken as prescribed. Do not skip doses of blood pressure medicine. Doing this puts you at risk for problems and can make the medicine less effective. Ask your health care provider about side effects or reactions to medicines that you should watch for. Contact a health care provider if you: Think you are having a reaction to a medicine you are taking. Have headaches that keep coming back (recurring). Feel dizzy. Have swelling in your ankles. Have trouble with your vision. Get help right away if you: Develop a severe headache or confusion. Have unusual weakness or numbness. Feel faint. Have severe pain in your chest or abdomen. Vomit repeatedly. Have trouble breathing. These symptoms may be an emergency. Get help right away. Call 911. Do not wait to see if the symptoms will go away. Do not drive yourself to the hospital. Summary Hypertension is when the force of blood pumping through your arteries is too strong. If this condition is not controlled, it may put you at risk for serious complications. Your personal target blood pressure may vary depending on your medical conditions, your age, and other factors. For most people, a normal blood pressure is less than 120/80. Hypertension is treated with lifestyle changes, medicines, or a combination of both. Lifestyle changes include losing weight, eating a healthy,   low-sodium diet, exercising more, and limiting alcohol. This information is not intended to replace advice given to you by your health care provider. Make sure you discuss any questions you have with your health care provider. Document Revised: 09/01/2021 Document Reviewed: 09/01/2021 Elsevier Patient Education  2023 [employer] Inc[employer]  

## 2022-11-09 ENCOUNTER — Other Ambulatory Visit: Payer: Self-pay | Admitting: Nurse Practitioner

## 2022-11-09 ENCOUNTER — Encounter: Payer: Self-pay | Admitting: Nurse Practitioner

## 2022-11-09 DIAGNOSIS — N138 Other obstructive and reflux uropathy: Secondary | ICD-10-CM

## 2022-11-09 MED ORDER — FINASTERIDE 5 MG PO TABS: ORAL_TABLET | ORAL | 3 refills | Status: DC

## 2022-11-09 MED ORDER — TAMSULOSIN HCL 0.4 MG PO CAPS: ORAL_CAPSULE | ORAL | 3 refills | Status: DC

## 2022-11-15 ENCOUNTER — Encounter: Payer: Self-pay | Admitting: Nurse Practitioner

## 2022-11-15 DIAGNOSIS — Z1331 Encounter for screening for depression: Secondary | ICD-10-CM | POA: Diagnosis not present

## 2022-11-15 DIAGNOSIS — J22 Unspecified acute lower respiratory infection: Secondary | ICD-10-CM | POA: Diagnosis not present

## 2022-11-15 DIAGNOSIS — I10 Essential (primary) hypertension: Secondary | ICD-10-CM | POA: Diagnosis not present

## 2022-11-15 DIAGNOSIS — Z6827 Body mass index (BMI) 27.0-27.9, adult: Secondary | ICD-10-CM | POA: Diagnosis not present

## 2022-12-04 ENCOUNTER — Encounter: Payer: Self-pay | Admitting: Nurse Practitioner

## 2022-12-04 ENCOUNTER — Other Ambulatory Visit: Payer: Self-pay | Admitting: Nurse Practitioner

## 2022-12-04 DIAGNOSIS — E782 Mixed hyperlipidemia: Secondary | ICD-10-CM

## 2022-12-04 DIAGNOSIS — I10 Essential (primary) hypertension: Secondary | ICD-10-CM

## 2022-12-04 MED ORDER — HYDROCHLOROTHIAZIDE 12.5 MG PO TABS: ORAL_TABLET | ORAL | 3 refills | Status: DC

## 2022-12-04 MED ORDER — ATENOLOL 25 MG PO TABS: ORAL_TABLET | ORAL | 2 refills | Status: DC

## 2022-12-04 MED ORDER — SIMVASTATIN 40 MG PO TABS: ORAL_TABLET | ORAL | 3 refills | Status: DC

## 2023-03-21 ENCOUNTER — Encounter: Payer: Self-pay | Admitting: Internal Medicine

## 2023-03-21 ENCOUNTER — Ambulatory Visit (INDEPENDENT_AMBULATORY_CARE_PROVIDER_SITE_OTHER): Payer: PPO | Admitting: Internal Medicine

## 2023-03-21 VITALS — BP 126/74 | HR 51 | Temp 97.9°F | Resp 16 | Ht 70.0 in | Wt 189.2 lb

## 2023-03-21 DIAGNOSIS — I10 Essential (primary) hypertension: Secondary | ICD-10-CM

## 2023-03-21 DIAGNOSIS — Z87891 Personal history of nicotine dependence: Secondary | ICD-10-CM

## 2023-03-21 DIAGNOSIS — N138 Other obstructive and reflux uropathy: Secondary | ICD-10-CM

## 2023-03-21 DIAGNOSIS — I700 Atherosclerosis of aorta: Secondary | ICD-10-CM | POA: Diagnosis not present

## 2023-03-21 DIAGNOSIS — N401 Enlarged prostate with lower urinary tract symptoms: Secondary | ICD-10-CM | POA: Diagnosis not present

## 2023-03-21 DIAGNOSIS — Z1211 Encounter for screening for malignant neoplasm of colon: Secondary | ICD-10-CM

## 2023-03-21 DIAGNOSIS — E782 Mixed hyperlipidemia: Secondary | ICD-10-CM

## 2023-03-21 DIAGNOSIS — Z125 Encounter for screening for malignant neoplasm of prostate: Secondary | ICD-10-CM

## 2023-03-21 DIAGNOSIS — Z0000 Encounter for general adult medical examination without abnormal findings: Secondary | ICD-10-CM | POA: Diagnosis not present

## 2023-03-21 DIAGNOSIS — Z0001 Encounter for general adult medical examination with abnormal findings: Secondary | ICD-10-CM

## 2023-03-21 DIAGNOSIS — Z136 Encounter for screening for cardiovascular disorders: Secondary | ICD-10-CM | POA: Diagnosis not present

## 2023-03-21 DIAGNOSIS — Z79899 Other long term (current) drug therapy: Secondary | ICD-10-CM | POA: Diagnosis not present

## 2023-03-21 DIAGNOSIS — R7309 Other abnormal glucose: Secondary | ICD-10-CM | POA: Diagnosis not present

## 2023-03-21 DIAGNOSIS — E559 Vitamin D deficiency, unspecified: Secondary | ICD-10-CM | POA: Diagnosis not present

## 2023-03-21 LAB — CBC WITH DIFFERENTIAL/PLATELET
Eosinophils Relative: 3 %
HCT: 42.5 % (ref 38.5–50.0)
MCH: 30.6 pg (ref 27.0–33.0)
MPV: 10.2 fL (ref 7.5–12.5)
Monocytes Relative: 9.7 %
Neutrophils Relative %: 56.6 %
RBC: 4.68 10*6/uL (ref 4.20–5.80)
RDW: 13 % (ref 11.0–15.0)

## 2023-03-21 NOTE — Progress Notes (Signed)
[facility]  Lucky Cowboy, M.D.   Rance Muir, D.NP  Adela Glimpse, D.NP  [facility] [address]  [place], [place]. [address] Telephone [phone] Telefax [phone]  Annual  Screening/Preventative Visit &  Comprehensive Evaluation & Examination  Future Appointments  Date Time Provider Department  03/21/2023                  cpe  3:00 PM Lucky Cowboy, MD [facility]  07/05/2023                  wellness  4:00 PM Raynelle Dick, NP [facility]  03/27/2024                 cpe  3:00 PM Lucky Cowboy, MD [facility]         This very nice 77 y.o. MWM  presents for a Screening /Preventative Visit & comprehensive evaluation and management of multiple medical co-morbidities.  Patient has been followed for HTN, HLD, Prediabetes and Vitamin D Deficiency.       HTN predates circa 1996. Patient's BP has been controlled at home.  Today's BP is at goal - 126/74. Patient denies any cardiac symptoms as chest pain, palpitations, shortness of breath, dizziness or ankle swelling. Wife relates patient only taking 1/2 tab of the HCTZ 12.5 - probably a sub-therapeutic dose & advised to stop.        Patient's hyperlipidemia is controlled with diet and Simvastatin. Patient denies myalgias or other medication SE's. Last lipids were at goal :  Lab Results  Component Value Date   CHOL 134 09/28/2021   HDL 58 09/28/2021   LDLCALC 60 09/28/2021   TRIG 82 09/28/2021   CHOLHDL 2.3 09/28/2021         Patient has hx/o prediabetes (A1c 5.9% /2010) and patient denies reactive hypoglycemic symptoms, visual blurring, diabetic polys or paresthesias. Last A1c was normal & at goal :   Lab Results  Component Value Date   HGBA1C 5.2 09/28/2021          Finally, patient has history of Vitamin D Deficiency ("41" /2008) and last vitamin D was at goal :   Lab Results  Component Value Date   VD25OH 89 03/11/2021     Current  Outpatient Medications on File Prior to Visit  Medication Sig   VITAMIN C Take 1 tablet  daily.   aspirin 81 MG tablet Take  daily.   atenolol 25 MG tablet Takes 1 tablet daily for BP   VITAMIN D  Take 2,000 Units  2  times daily.    finasteride 5 MG tablet Take  1 tablet  Daily  for Prostate   Hydrochlorothiazide 12.5 MG tablet Take 1 tablet Daily   Omega-3 FISH OIL Take  daily.   simvastatin  40 MG tablet Take  1 tablet  at Bedtime  for Cholesterol   tamsulosin 0.4 MG CAPS  Take 1 tablet  at Bedtime  for Prostate   zinc 50 MG tablet Take daily.    No Known Allergies   Past Medical History:  Diagnosis Date   BPH (benign prostatic hyperplasia)    GERD (gastroesophageal reflux disease)    Hyperlipidemia    Hypertension    Prediabetes    Vitamin D deficiency      Health Maintenance  Topic Date Due   COVID-19 Vaccine (1) Never done   Zoster Vaccines- Shingrix (1 of 2) Never done   INFLUENZA  VACCINE  06/08/2022   TETANUS/TDAP  04/21/2028   Pneumonia Vaccine 68+ Years old  Completed   Hepatitis C Screening  Completed   HPV VACCINES  Aged Out     Immunization History  Administered Date(s) Administered   Influenza, High Dose 08/21/2014, 09/09/2015, 07/06/2016, 08/25/2020   Influenza 08/08/2018, 08/16/2019, 08/29/2021   Pneumococcal -13 01/30/2019   Pneumococcal -23 06/05/2009   Td 01/26/2007, 04/21/2018    Last Colon -  10/10/2018 - Dr Rosana Hoes - recc 5 yr f/u - due Dec 2024   Past Surgical History:  Procedure Laterality Date   CEREBRAL ANEURYSM REPAIR  1966     Family History  Problem Relation Age of Onset   Alzheimer's disease Mother    Cancer Father        colon   Diabetes Sister      Social History    Marital status: Married   Number of children: 1 son & 1 daughter & 7 GC      Occupational History   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 Does c/o fatigue. Eyes: Denies redness, blurred vision, diplopia, discharge, itchy or watery eyes.  ENT: Denies discharge, congestion, post nasal drip, epistaxis, sore throat, earache, hearing loss, dental pain, Tinnitus, Vertigo, Sinus pain or snoring.  Cardio: Denies chest pain, palpitations, irregular heartbeat, syncope, dyspnea, diaphoresis, orthopnea, PND, claudication or edema Respiratory: denies cough, dyspnea, DOE, pleurisy, hoarseness, laryngitis or wheezing.  Gastrointestinal: Denies dysphagia, heartburn, reflux, water brash, pain, cramps, nausea, vomiting, bloating, diarrhea, constipation, hematemesis, melena, hematochezia, jaundice or hemorrhoids Genitourinary: Denies dysuria, frequency, discharge, hematuria or flank pain. Has urgency, nocturia x 0-3 & occasional hesitancy. Musculoskeletal: Denies arthralgia, myalgia, stiffness, Jt. Swelling, pain, limp or strain/sprain. Denies Falls. Skin: Denies puritis, rash, hives, warts, acne, eczema or change in skin lesion Neuro: No weakness, tremor, incoordination, spasms, paresthesia or pain Psychiatric: Denies confusion, memory loss or sensory loss. Denies Depression. Endocrine: Denies change in weight, skin, hair change, nocturia, and paresthesia, diabetic polys, visual blurring or hyper / hypo glycemic episodes.  Heme/Lymph: No excessive bleeding, bruising or enlarged lymph nodes.   Physical Exam  BP 126/74   Pulse (!) 51   Temp 97.9 F (36.6 C)   Resp 16   Ht 5\' 10"  (1.778 m)   Wt 189 lb 3.2 oz (85.8 kg)   SpO2 98%   BMI 27.15 kg/m   General Appearance: Well nourished and well groomed and in no apparent distress.  Eyes: PERRLA, EOMs, conjunctiva no swelling or erythema, normal fundi and  vessels. Sinuses: No frontal/maxillary tenderness ENT/Mouth: EACs patent / TMs  nl. Nares clear without erythema, swelling, mucoid exudates. Oral hygiene is good. No erythema, swelling, or exudate. Tongue normal, non-obstructing. Tonsils not swollen or erythematous. Hearing normal.  Neck: Supple, thyroid not palpable. No bruits, nodes or JVD. Respiratory: Respiratory effort normal.  BS equal and clear bilateral without rales, rhonci, wheezing or stridor. Cardio: Heart sounds are normal with regular rate and rhythm and no murmurs, rubs or gallops. Peripheral pulses are normal and equal bilaterally without edema. No aortic or femoral  bruits. Chest: symmetric with normal excursions and percussion.  Abdomen: Soft, with Nl bowel sounds. Nontender, no guarding, rebound, hernias, masses, or organomegaly.  Lymphatics: Non tender without lymphadenopathy.  Musculoskeletal: Full ROM all peripheral extremities, joint stability, 5/5 strength, and normal gait. Skin: Warm and dry without rashes, lesions, cyanosis, clubbing or  ecchymosis.  Neuro: Cranial nerves intact, reflexes equal bilaterally. Normal muscle tone, no cerebellar symptoms. Sensation intact.  Pysch: Alert and oriented X 3 with normal affect, insight and judgment appropriate.   Assessment and Plan  1. Annual Preventative/Screening Exam    2. Essential hypertension  - D/C Diuretic  & monitor BP's & for edema.  - EKG 12-Lead - [place], RETROPERITNL ABD,  LTD - Urinalysis, Routine w reflex microscopic - Microalbumin / creatinine urine ratio - CBC with Differential/Platelet - COMPLETE METABOLIC PANEL WITH GFR - Magnesium - TSH  3. Hyperlipidemia, mixed  - EKG 12-Lead - [place], RETROPERITNL ABD,  LTD - Lipid panel - TSH  4. Abnormal glucose  - EKG 12-Lead - [place], RETROPERITNL ABD,  LTD - Hemoglobin A1c - Insulin, random  5. Vitamin D deficiency  - VITAMIN D 25 Hydroxy   6. BPH with obstruction/lower urinary tract symptoms  -  PSA  7. Screening for colorectal cancer  - POC Hemoccult Bld/Stl   8. Prostate cancer screening  - PSA  9. Screening for heart disease  - EKG 12-Lead  10. Former smoker  - EKG 12-Lead - [place], RETROPERITNL ABD,  LTD  11. Screening for AAA (aortic abdominal aneurysm)  - [place], RETROPERITNL ABD,  LTD  12. Medication management  - Urinalysis, Routine w reflex microscopic - Microalbumin / creatinine urine ratio - CBC with Differential/Platelet - COMPLETE METABOLIC PANEL WITH GFR - Magnesium - Lipid panel - TSH - Hemoglobin A1c - Insulin, random - VITAMIN D 25 Hydroxy          Patient was counseled in prudent diet, weight control to achieve/maintain BMI less than 25, BP monitoring, regular exercise and medications as discussed.  Discussed med effects and SE's. Routine screening labs and tests as requested with regular follow-up as recommended. Over 40 minutes of exam, counseling, chart review and high complex critical decision making was performed   Marinus Maw, MD

## 2023-03-21 NOTE — Patient Instructions (Signed)

## 2023-03-22 DIAGNOSIS — H02831 Dermatochalasis of right upper eyelid: Secondary | ICD-10-CM | POA: Diagnosis not present

## 2023-03-22 DIAGNOSIS — D229 Melanocytic nevi, unspecified: Secondary | ICD-10-CM | POA: Diagnosis not present

## 2023-03-22 DIAGNOSIS — H02834 Dermatochalasis of left upper eyelid: Secondary | ICD-10-CM | POA: Diagnosis not present

## 2023-03-22 DIAGNOSIS — H04123 Dry eye syndrome of bilateral lacrimal glands: Secondary | ICD-10-CM | POA: Diagnosis not present

## 2023-03-22 DIAGNOSIS — H25813 Combined forms of age-related cataract, bilateral: Secondary | ICD-10-CM | POA: Diagnosis not present

## 2023-03-22 DIAGNOSIS — L821 Other seborrheic keratosis: Secondary | ICD-10-CM | POA: Diagnosis not present

## 2023-03-22 LAB — COMPLETE METABOLIC PANEL WITH GFR
AG Ratio: 2 (calc) (ref 1.0–2.5)
ALT: 18 U/L (ref 9–46)
AST: 25 U/L (ref 10–35)
Albumin: 4.4 g/dL (ref 3.6–5.1)
Alkaline phosphatase (APISO): 78 U/L (ref 35–144)
BUN: 20 mg/dL (ref 7–25)
CO2: 32 mmol/L (ref 20–32)
Calcium: 9.7 mg/dL (ref 8.6–10.3)
Chloride: 102 mmol/L (ref 98–110)
Creat: 1.19 mg/dL (ref 0.70–1.28)
Globulin: 2.2 g/dL (calc) (ref 1.9–3.7)
Glucose, Bld: 84 mg/dL (ref 65–99)
Potassium: 3.8 mmol/L (ref 3.5–5.3)
Sodium: 140 mmol/L (ref 135–146)
Total Bilirubin: 0.6 mg/dL (ref 0.2–1.2)
Total Protein: 6.6 g/dL (ref 6.1–8.1)
eGFR: 63 mL/min/{1.73_m2} (ref >=60)

## 2023-03-22 LAB — CBC WITH DIFFERENTIAL/PLATELET
Absolute Monocytes: 640 cells/uL (ref 200–950)
Basophils Absolute: 53 cells/uL (ref 0–200)
Basophils Relative: 0.8 %
Eosinophils Absolute: 198 cells/uL (ref 15–500)
Hemoglobin: 14.3 g/dL (ref 13.2–17.1)
Lymphs Abs: 1973 cells/uL (ref 850–3900)
MCHC: 33.6 g/dL (ref 32.0–36.0)
MCV: 90.8 fL (ref 80.0–100.0)
Neutro Abs: 3736 cells/uL (ref 1500–7800)
Platelets: 214 10*3/uL (ref 140–400)
Total Lymphocyte: 29.9 %
WBC: 6.6 10*3/uL (ref 3.8–10.8)

## 2023-03-22 LAB — LIPID PANEL
Cholesterol: 144 mg/dL (ref <200)
HDL: 64 mg/dL (ref >=40)
LDL Cholesterol (Calc): 64 mg/dL (calc)
Non-HDL Cholesterol (Calc): 80 mg/dL (calc) (ref <130)
Total CHOL/HDL Ratio: 2.3 (calc) (ref <5.0)
Triglycerides: 79 mg/dL (ref <150)

## 2023-03-22 LAB — URINALYSIS, ROUTINE W REFLEX MICROSCOPIC
Bilirubin Urine: NEGATIVE
Glucose, UA: NEGATIVE
Hgb urine dipstick: NEGATIVE
Ketones, ur: NEGATIVE
Leukocytes,Ua: NEGATIVE
Nitrite: NEGATIVE
Protein, ur: NEGATIVE
Specific Gravity, Urine: 1.013 (ref 1.001–1.035)
pH: 5.5 (ref 5.0–8.0)

## 2023-03-22 LAB — MICROALBUMIN / CREATININE URINE RATIO
Creatinine, Urine: 99 mg/dL (ref 20–320)
Microalb Creat Ratio: 5 mg/g creat (ref <30)
Microalb, Ur: 0.5 mg/dL

## 2023-03-22 LAB — PSA: PSA: 0.29 ng/mL (ref <=4.00)

## 2023-03-22 LAB — VITAMIN D 25 HYDROXY (VIT D DEFICIENCY, FRACTURES): Vit D, 25-Hydroxy: 82 ng/mL (ref 30–100)

## 2023-03-22 LAB — INSULIN, RANDOM: Insulin: 3.5 u[IU]/mL

## 2023-03-22 LAB — TSH: TSH: 3.58 mIU/L (ref 0.40–4.50)

## 2023-03-22 LAB — HEMOGLOBIN A1C
Hgb A1c MFr Bld: 5.4 % of total Hgb (ref <5.7)
Mean Plasma Glucose: 108 mg/dL
eAG (mmol/L): 6 mmol/L

## 2023-03-22 LAB — MAGNESIUM: Magnesium: 2.1 mg/dL (ref 1.5–2.5)

## 2023-03-22 NOTE — Progress Notes (Signed)
^<^<^<^<^<^<^<^<^<^<^<^<^<^<^<^<^<^<^<^<^<^<^<^<^<^<^<^<^<^<^<^<^<^<^<^<^ ^>^>^>^>^>^>^>^>^>^>^>>^>^>^>^>^>^>^>^>^>^>^>^>^>^>^>^>^>^>^>^>^>^>^>^>^>  -  Test results slightly outside the reference range are not unusual. If there is anything important, I will review this with you,  otherwise it is considered normal test values.  If you have further questions,  please do not hesitate to contact me at the office or via My Chart.   ^<^<^<^<^<^<^<^<^<^<^<^<^<^<^<^<^<^<^<^<^<^<^<^<^<^<^<^<^<^<^<^<^<^<^<^<^  -   PSA- very low - No Prostate cancer - Great ! ^>^>^>^>^>^>^>^>^>^>^>^>^>^>^>^>^>^>^>^>^>^>^>^>^>^>^>^>^>^>^>^>^>^>^>^>^  -   Chol = 144 -  Excellent   - Very low risk for Heart Attack  / Stroke ^>^>^>^>^>^>^>^>^>^>^>^>^>^>^>^>^>^>^>^>^>^>^>^>^>^>^>^>^>^>^>^>^>^>^>^>^  -   A1c - Normal - No Diabetes  - Great !  ^>^>^>^>^>^>^>^>^>^>^>^>^>^>^>^>^>^>^>^>^>^>^>^>^>^>^>^>^>^>^>^>^>^>^>^>^  -   Vitamin D = 82 - Excellent   - Please  keep dose same  ^>^>^>^>^>^>^>^>^>^>^>^>^>^>^>^>^>^>^>^>^>^>^>^>^>^>^>^>^>^>^>^>^>^>^>^>^  -   All Else - CBC - Kidneys - Electrolytes - Liver - Magnesium & Thyroid    - all  Normal / OK ^>^>^>^>^>^>^>^>^>^>^>^>^>^>^>^>^>^>^>^>^>^>^>^>^>^>^>^>^>^>^>^>^>^>^>^>^ ^>^>^>^>^>^>^>^>^>^>^>^>^>^>^>^>^>^>^>^>^>^>^>^>^>^>^>^>^>^>^>^>^>^>^>^>^   -   Keep up the Great  Work  !  ^>^>^>^>^>^>^>^>^>^>^>^>^>^>^>^>^>^>^>^>^>^>^>^>^>^>^>^>^>^>^>^>^>^>^>^>^ ^>^>^>^>^>^>^>^>^>^>^>^>^>^>^>^>^>^>^>^>^>^>^>^>^>^>^>^>^>^>^>^>^>^>^>^>^

## 2023-04-18 ENCOUNTER — Other Ambulatory Visit: Payer: Self-pay

## 2023-04-18 DIAGNOSIS — Z1211 Encounter for screening for malignant neoplasm of colon: Secondary | ICD-10-CM

## 2023-04-18 DIAGNOSIS — Z1212 Encounter for screening for malignant neoplasm of rectum: Secondary | ICD-10-CM | POA: Diagnosis not present

## 2023-04-18 LAB — POC HEMOCCULT BLD/STL (HOME/3-CARD/SCREEN)
Card #2 Fecal Occult Blod, POC: NEGATIVE
Card #3 Fecal Occult Blood, POC: NEGATIVE
Fecal Occult Blood, POC: NEGATIVE

## 2023-04-19 ENCOUNTER — Encounter: Payer: Self-pay | Admitting: Internal Medicine

## 2023-07-05 ENCOUNTER — Ambulatory Visit: Payer: PPO | Admitting: Nurse Practitioner

## 2023-07-27 ENCOUNTER — Ambulatory Visit (INDEPENDENT_AMBULATORY_CARE_PROVIDER_SITE_OTHER): Payer: PPO | Admitting: Nurse Practitioner

## 2023-07-27 ENCOUNTER — Encounter: Payer: Self-pay | Admitting: Nurse Practitioner

## 2023-07-27 VITALS — BP 110/60 | HR 71 | Temp 97.9°F | Ht 70.0 in | Wt 192.2 lb

## 2023-07-27 DIAGNOSIS — E782 Mixed hyperlipidemia: Secondary | ICD-10-CM

## 2023-07-27 DIAGNOSIS — Z0001 Encounter for general adult medical examination with abnormal findings: Secondary | ICD-10-CM

## 2023-07-27 DIAGNOSIS — E559 Vitamin D deficiency, unspecified: Secondary | ICD-10-CM

## 2023-07-27 DIAGNOSIS — Z9889 Other specified postprocedural states: Secondary | ICD-10-CM | POA: Diagnosis not present

## 2023-07-27 DIAGNOSIS — N138 Other obstructive and reflux uropathy: Secondary | ICD-10-CM | POA: Diagnosis not present

## 2023-07-27 DIAGNOSIS — R6889 Other general symptoms and signs: Secondary | ICD-10-CM | POA: Diagnosis not present

## 2023-07-27 DIAGNOSIS — E663 Overweight: Secondary | ICD-10-CM

## 2023-07-27 DIAGNOSIS — R7309 Other abnormal glucose: Secondary | ICD-10-CM | POA: Diagnosis not present

## 2023-07-27 DIAGNOSIS — N182 Chronic kidney disease, stage 2 (mild): Secondary | ICD-10-CM | POA: Diagnosis not present

## 2023-07-27 DIAGNOSIS — Z8679 Personal history of other diseases of the circulatory system: Secondary | ICD-10-CM | POA: Diagnosis not present

## 2023-07-27 DIAGNOSIS — N401 Enlarged prostate with lower urinary tract symptoms: Secondary | ICD-10-CM | POA: Diagnosis not present

## 2023-07-27 DIAGNOSIS — Z23 Encounter for immunization: Secondary | ICD-10-CM

## 2023-07-27 DIAGNOSIS — I10 Essential (primary) hypertension: Secondary | ICD-10-CM | POA: Diagnosis not present

## 2023-07-27 DIAGNOSIS — Z79899 Other long term (current) drug therapy: Secondary | ICD-10-CM | POA: Diagnosis not present

## 2023-07-27 DIAGNOSIS — Z0000 Encounter for general adult medical examination without abnormal findings: Secondary | ICD-10-CM

## 2023-07-27 NOTE — Progress Notes (Signed)
MEDICARE ANNUAL WELLNESS VISIT AND FOLLOW UP Assessment:   Diagnoses and all orders for this visit:  Encounter for Medicare annual wellness exam Due yearly  Essential hypertension Continue medication- Atenolol 25 mg 1/2 tab every day  Monitor blood pressure at home; call if consistently over 130/80 Continue DASH diet.   Reminder to go to the ER if any CP, SOB, nausea, dizziness, severe HA, changes vision/speech, left arm numbness and tingling and jaw pain. CBC  Gastroesophageal reflux disease, esophagitis presence not specified Well managed by lifestyle  Discussed diet, avoiding triggers and other lifestyle changes  Benign prostatic hyperplasia, unspecified whether lower urinary tract symptoms present Currently off of medications and asymptomatic  Vitamin D deficiency At goal at recent check; continue to recommend supplementation for goal of 60-100 Defer vitamin D level  Stage 2 CKD Increase fluids, avoid NSAIDS, monitor sugars, will monitor -CMP   Abnormal glucose Recent A1Cs at goal Discussed diet/exercise, weight management  A1C; check CMP  Mixed hyperlipidemia Continue medications Continue low cholesterol diet and exercise.  Check lipid panel.  CMP  Overweight (BMI 25.0-29.9) Long discussion about weight loss, diet, and exercise Recommended diet heavy in fruits and veggies and low in animal meats, cheeses, and dairy products, appropriate calorie intake Discussed appropriate weight for height  Start exercising - 150 min/week Follow up at next visit  History of cerebral aneurysm repair Stable since 1996; control blood pressure  Medication Management Magnesium  Flu Vaccine Need High dose flu vaccine given today  Over 30 minutes of exam, counseling, chart review, and critical decision making was performed  Future Appointments  Date Time Provider Department Center  10/31/2023  2:30 PM Lucky Cowboy, MD [facility] None  03/27/2024  3:00 PM Lucky Cowboy, MD [facility] None  07/30/2024 11:30 AM Raynelle Dick, NP [facility] None     Plan:   During the course of the visit the patient was educated and counseled about appropriate screening and preventive services including:   Pneumococcal vaccine  Influenza vaccine Prevnar 13 Td vaccine Screening electrocardiogram Colorectal cancer screening Diabetes screening Glaucoma screening Nutrition counseling    Subjective:  Brendan Cirrito. is a 77 y.o. male who presents for Medicare Annual Wellness Visit and 3 month follow up for HTN, hyperlipidemia, glucose management, and vitamin D Def.   He has hx of cerebral aneurysm repair in 1996 after which he has some mild short term memory loss which has been stable.  he has a diagnosis of GERD which is currently managed by lifestyle, no problems in several years.   BMI is Body mass index is 27.58 kg/m., he has been working on diet- has cut out junk food. Doing more walking He does go fishing regularly at the beach with some walking, at least 3 days a week.  Wt Readings from Last 3 Encounters:  07/27/23 192 lb 3.2 oz (87.2 kg)  03/21/23 189 lb 3.2 oz (85.8 kg)  10/25/22 185 lb 6.4 oz (84.1 kg)   His blood pressure has been controlled at home, today their BP is BP: 110/60  BP Readings from Last 3 Encounters:  07/27/23 110/60  03/21/23 126/74  10/25/22 (!) 132/58  He does workout. He denies chest pain, shortness of breath, dizziness.   He is on cholesterol medication (simvastatin 40 mg daily) and denies myalgias. His cholesterol is at goal. The cholesterol last visit was:   Lab Results  Component Value Date   CHOL 144 03/21/2023   HDL 64 03/21/2023   LDLCALC  64 03/21/2023   TRIG 79 03/21/2023   CHOLHDL 2.3 03/21/2023   He has been working on diet and exercise for glucose managment, and denies foot ulcerations, increased appetite, nausea, paresthesia of the feet, polydipsia, polyuria, visual disturbances, vomiting and weight  loss. Last A1C in the office was:  Lab Results  Component Value Date   HGBA1C 5.4 03/21/2023   Has been trying to drink 64 ounces of water daily. Last GFR Lab Results  Component Value Date   EGFR 63 03/21/2023     Patient is on Vitamin D supplement.   Lab Results  Component Value Date   VD25OH 66 03/21/2023     He is uncertain if he has ever had chickenpox - did blood test and did show it was positive  Medication Review:   Current Outpatient Medications (Cardiovascular):    atenolol (TENORMIN) 25 MG tablet, Takes 1 tablet daily for BP   hydrochlorothiazide (HYDRODIURIL) 12.5 MG tablet, Take 1 tablet Daily for BP & Fluid Retention /Ankle Swelling   simvastatin (ZOCOR) 40 MG tablet, Take  1 tablet  at Bedtime  for Cholesterol   Current Outpatient Medications (Analgesics):    aspirin 81 MG tablet, Take 81 mg by mouth daily.   Current Outpatient Medications (Other):    Ascorbic Acid (VITAMIN C PO), Take 1 tablet by mouth daily.   B Complex-Biotin-FA (B-COMPLEX PO), Take by mouth.   Cholecalciferol (VITAMIN D PO), Take 2,000 Units by mouth once. Every other day   finasteride (PROSCAR) 5 MG tablet, TAKE ONE TABLET BY MOUTH DAILY FOR PROSTATE   Omega-3 Fatty Acids (FISH OIL PO), Take by mouth daily.   tamsulosin (FLOMAX) 0.4 MG CAPS capsule, Take one tablet at bedtime for Prostate.   zinc gluconate 50 MG tablet, Take 50 mg by mouth daily.  Allergies: No Known Allergies  Current Problems (verified) has Other abnormal glucose; Hyperlipidemia; Hypertension; GERD (gastroesophageal reflux disease); BPH (benign prostatic hyperplasia); Vitamin D deficiency; Medication management; Overweight (BMI 25.0-29.9); and History of cerebral aneurysm repair on their problem list.  Screening Tests Immunization History  Administered Date(s) Administered   Influenza, High Dose Seasonal PF 08/21/2014, 09/09/2015, 07/06/2016, 08/25/2020   Influenza-Unspecified 08/08/2018, 08/16/2019, 08/29/2021,  09/08/2022   Pneumococcal Conjugate-13 01/30/2019   Pneumococcal Polysaccharide-23 06/05/2009   Td 01/26/2007, 04/21/2018   Health Maintenance  Topic Date Due   INFLUENZA VACCINE  06/09/2023   Colonoscopy  10/11/2023   COVID-19 Vaccine (1) 08/12/2023 (Originally 04/06/1951)   Zoster Vaccines- Shingrix (1 of 2) 10/26/2023 (Originally 04/07/1965)   Medicare Annual Wellness (AWV)  07/26/2024   DTaP/Tdap/Td (3 - Tdap) 04/21/2028   Pneumonia Vaccine 36+ Years old  Completed   Hepatitis C Screening  Completed   HPV VACCINES  Aged Out     Names of Other Physician/Practitioners you currently use: 1. [facility] here for primary care 2. Dr. Dione Booze , eye doctor, last visit 03/22/23 3. Dr. Harl Bowie, dentist, last visit 07/25/23  Patient Care Team: Lucky Cowboy, MD as PCP - General (Internal Medicine) Louis Meckel, MD (Inactive) as Consulting Physician (Gastroenterology)  Surgical: He  has a past surgical history that includes Cerebral aneurysm repair (1966). Family His family history includes Alzheimer's disease in his mother; Cancer in his father; Diabetes in his sister. Social history  He reports that he quit smoking about 49 years ago. His smoking use included cigarettes. He has never used smokeless tobacco. He reports that he does not drink alcohol and does not use  drugs.  MEDICARE WELLNESS OBJECTIVES: Physical activity:  sedentary Cardiac risk factors: Cardiac Risk Factors include: advanced age (>64men, >61 women);dyslipidemia;hypertension;male gender;sedentary lifestyle;smoking/ tobacco exposure Depression/mood screen:      07/27/2023    4:13 PM  Depression screen PHQ 2/9  Decreased Interest 0  Down, Depressed, Hopeless 0  PHQ - 2 Score 0    ADLs:     07/27/2023    4:12 PM 09/29/2022    4:20 PM  In your present state of health, do you have any difficulty performing the following activities:  Hearing? 0 0  Vision? 0 0   Difficulty concentrating or making decisions? 0 1  Walking or climbing stairs? 0 0  Dressing or bathing? 0 0  Doing errands, shopping? 0 1      Cognitive Testing  Alert? Yes  Normal Appearance?Yes  Oriented to person? Yes  Place? Yes   Time? Yes  Recall of three objects?  1/3  Can perform simple calculations? Yes  Displays appropriate judgment?Yes  Can read the correct time from a watch face?Yes  EOL planning: Does Patient Have a Medical Advance Directive?: Yes Type of Advance Directive: Healthcare Power of Attorney, Living will Does patient want to make changes to medical advance directive?: No - Patient declined Copy of Healthcare Power of Attorney in Chart?: No - copy requested   Objective:   Today's Vitals   07/27/23 1523  BP: 110/60  Pulse: 71  Temp: 97.9 F (36.6 C)  SpO2: 95%  Weight: 192 lb 3.2 oz (87.2 kg)  Height: 5\' 10"  (1.778 m)      Body mass index is 27.58 kg/m.  General appearance: alert, no distress, WD/WN, male HEENT: normocephalic, sclerae anicteric, TMs pearly, nares patent, no discharge or erythema, pharynx normal Oral cavity: MMM, no lesions Neck: supple, no lymphadenopathy, no thyromegaly, no masses Heart: RRR, normal S1, S2, no murmurs Lungs: CTA bilaterally, no wheezes, rhonchi, or rales Abdomen: +bs, soft, non tender, non distended, no masses, no hepatomegaly, no splenomegaly Musculoskeletal: nontender, no swelling, previous fracture left third finger misaligned Extremities: no edema, no cyanosis, no clubbing Pulses: 2+ symmetric, upper and lower extremities, normal cap refill Neurological: alert, oriented x 3, CN2-12 intact, strength normal upper extremities and lower extremities, sensation normal throughout, DTRs 2+ throughout, no cerebellar signs, gait normal Psychiatric: normal affect, behavior normal, pleasant    Medicare Attestation I have personally reviewed: The patient's medical and social history Their use of alcohol,  tobacco or illicit drugs Their current medications and supplements The patient's functional ability including ADLs,fall risks, home safety risks, cognitive, and hearing and visual impairment Diet and physical activities Evidence for depression or mood disorders  The patient's weight, height, BMI, and visual acuity have been recorded in the chart.  I have made referrals, counseling, and provided education to the patient based on review of the above and I have provided the patient with a written personalized care plan for preventive services.     Raynelle Dick, NP   07/27/2023

## 2023-07-27 NOTE — Patient Instructions (Signed)

## 2023-07-28 LAB — COMPLETE METABOLIC PANEL WITH GFR
AG Ratio: 1.8 (calc) (ref 1.0–2.5)
ALT: 22 U/L (ref 9–46)
AST: 26 U/L (ref 10–35)
Albumin: 4 g/dL (ref 3.6–5.1)
Alkaline phosphatase (APISO): 75 U/L (ref 35–144)
BUN: 20 mg/dL (ref 7–25)
CO2: 30 mmol/L (ref 20–32)
Calcium: 9.4 mg/dL (ref 8.6–10.3)
Chloride: 102 mmol/L (ref 98–110)
Creat: 1.09 mg/dL (ref 0.70–1.28)
Globulin: 2.2 g/dL (calc) (ref 1.9–3.7)
Glucose, Bld: 150 mg/dL — ABNORMAL HIGH (ref 65–99)
Potassium: 3.4 mmol/L — ABNORMAL LOW (ref 3.5–5.3)
Sodium: 140 mmol/L (ref 135–146)
Total Bilirubin: 0.5 mg/dL (ref 0.2–1.2)
Total Protein: 6.2 g/dL (ref 6.1–8.1)
eGFR: 69 mL/min/{1.73_m2} (ref >=60)

## 2023-07-28 LAB — CBC WITH DIFFERENTIAL/PLATELET
Absolute Monocytes: 609 cells/uL (ref 200–950)
Basophils Absolute: 61 cells/uL (ref 0–200)
Basophils Relative: 0.7 %
Eosinophils Absolute: 218 cells/uL (ref 15–500)
Eosinophils Relative: 2.5 %
HCT: 39.8 % (ref 38.5–50.0)
Hemoglobin: 13.5 g/dL (ref 13.2–17.1)
Lymphs Abs: 1775 cells/uL (ref 850–3900)
MCH: 31.1 pg (ref 27.0–33.0)
MCHC: 33.9 g/dL (ref 32.0–36.0)
MCV: 91.7 fL (ref 80.0–100.0)
MPV: 9.9 fL (ref 7.5–12.5)
Monocytes Relative: 7 %
Neutro Abs: 6038 cells/uL (ref 1500–7800)
Neutrophils Relative %: 69.4 %
Platelets: 268 10*3/uL (ref 140–400)
RBC: 4.34 10*6/uL (ref 4.20–5.80)
RDW: 13 % (ref 11.0–15.0)
Total Lymphocyte: 20.4 %
WBC: 8.7 10*3/uL (ref 3.8–10.8)

## 2023-07-28 LAB — LIPID PANEL
Cholesterol: 136 mg/dL (ref <200)
HDL: 48 mg/dL (ref >=40)
LDL Cholesterol (Calc): 60 mg/dL (calc)
Non-HDL Cholesterol (Calc): 88 mg/dL (calc) (ref <130)
Total CHOL/HDL Ratio: 2.8 (calc) (ref <5.0)
Triglycerides: 211 mg/dL — ABNORMAL HIGH (ref <150)

## 2023-07-28 LAB — TSH: TSH: 2.47 mIU/L (ref 0.40–4.50)

## 2023-10-10 ENCOUNTER — Ambulatory Visit: Payer: PPO | Admitting: Internal Medicine

## 2023-10-20 ENCOUNTER — Encounter: Payer: Self-pay | Admitting: Nurse Practitioner

## 2023-10-20 DIAGNOSIS — N138 Other obstructive and reflux uropathy: Secondary | ICD-10-CM

## 2023-10-20 MED ORDER — TAMSULOSIN HCL 0.4 MG PO CAPS
ORAL_CAPSULE | ORAL | 3 refills | Status: DC
Start: 2023-10-20 — End: 2024-03-26

## 2023-10-30 ENCOUNTER — Encounter: Payer: Self-pay | Admitting: Internal Medicine

## 2023-10-30 NOTE — Patient Instructions (Signed)

## 2023-10-30 NOTE — Progress Notes (Unsigned)
[place]      ADULT   &   ADOLESCENT      INTERNAL MEDICINE  Lucky Cowboy, M.D.          Rance Muir, ANP        Adela Glimpse, FNP  [facility] [address]  [place], [place]. [address] Telephone [phone] Telefax [phone]   Future Appointments  Date Time Provider Department  10/31/2023                      6 mo ov   2:30 PM Lucky Cowboy, MD [facility]  03/27/2024                      cpe  3:00 PM Lucky Cowboy, MD [facility]  07/30/2024                     wellness  11:30 AM Raynelle Dick, NP [facility]    History of Present Illness:       This very nice 77 y.o. MWM with HTN, HLD, Prediabetes and Vitamin D Deficiency presents for 6 month follow up .         Patient is treated for HTN  (1996)  & BP has been controlled at home. Today's BP is at  goal - 133/81. Patient has had no complaints of any cardiac type chest pain, palpitations, dyspnea Pollyann Kennedy /PND, dizziness, claudication  or dependent edema.        Hyperlipidemia is controlled with diet & meds. Patient denies myalgias or other med SE's. Last Lipids were at goal except elevated Trig's :  Lab Results  Component Value Date   CHOL 136 07/27/2023   HDL 48 07/27/2023   LDLCALC 60 07/27/2023   TRIG 211 (H) 07/27/2023   CHOLHDL 2.8 07/27/2023     Also, the patient has history of PreDiabetes and has had no symptoms of reactive hypoglycemia, diabetic polys, paresthesias or visual blurring.  Last A1c was normal & at goal :  Lab Results  Component Value Date   HGBA1C 5.4 03/21/2023                                                         Further, the patient also has history of Vitamin D Deficiency and supplements vitamin D . Last vitamin D was at goal :  Lab Results  Component Value Date   VD25OH 82 03/21/2023     Current Outpatient Medications on File Prior to Visit  Medication Sig   VITAMIN C  Take 1 tablet  daily.   aspirin 81 MG tablet Take daily.    atenolol (TENORMIN) 25 MG tablet Takes 1 tablet daily for BP   B-COMPLEX  Take    VITAMIN D  2,000 Units  Take  Every other day   finasteride (PROSCAR) 5 MG tablet TAKE ONE TABLET DAILY   hydrochlorothiazide  12.5 MG tablet Take 1 tablet Daily    Omega-3 ISH OIL  Take  daily.   simvastatin  40 MG tablet Take  1 tablet  at Bedtime     Tamsulosin  0.4 MG CAPS  Take one tablet at bedtime    zinc gluconate 50 MG tablet Take daily.    No Known Allergies   PMHx:  Past Medical History:  Diagnosis Date   BPH (benign prostatic hyperplasia)    GERD (gastroesophageal reflux disease)    Hyperlipidemia    Hypertension    Prediabetes    Vitamin D deficiency      Immunization History  Administered Date(s) Administered   Influenza, High Dose  08/25/2020, 07/27/2023   Influenza 08/08/2018, 08/16/2019, 08/29/2021, 09/08/2022   Pneumococcal -13 01/30/2019   Pneumococcal -23 06/05/2009   Td 01/26/2007, 04/21/2018     Past Surgical History:  Procedure Laterality Date   CEREBRAL ANEURYSM REPAIR  1966    FHx:    Reviewed / unchanged   SHx:    Reviewed / unchanged    Systems Review:  Constitutional: Denies fever, chills, wt changes, headaches, insomnia, fatigue, night sweats, change in appetite. Eyes: Denies redness, blurred vision, diplopia, discharge, itchy, watery eyes.  ENT: Denies discharge, congestion, post nasal drip, epistaxis, sore throat, earache, hearing loss, dental pain, tinnitus, vertigo, sinus pain, snoring.  CV: Denies chest pain, palpitations, irregular heartbeat, syncope, dyspnea, diaphoresis, orthopnea, PND, claudication or edema. Respiratory: denies cough, dyspnea, DOE, pleurisy, hoarseness, laryngitis, wheezing.  Gastrointestinal: Denies dysphagia, odynophagia, heartburn, reflux, water brash, abdominal pain or cramps, nausea, vomiting, bloating, diarrhea, constipation, hematemesis, melena, hematochezia  or hemorrhoids. Genitourinary: Denies dysuria, frequency,  urgency, nocturia, hesitancy, discharge, hematuria or flank pain. Musculoskeletal: Denies arthralgias, myalgias, stiffness, jt. swelling, pain, limping or strain/sprain.  Skin: Denies pruritus, rash, hives, warts, acne, eczema or change in skin lesion(s). Neuro: No weakness, tremor, incoordination, spasms, paresthesia or pain. Psychiatric: Denies confusion, memory loss or sensory loss. Endo: Denies change in weight, skin or hair change.  Heme/Lymph: No excessive bleeding, bruising or enlarged lymph nodes.   Physical Exam  BP 133/81   Pulse (!) 53   Temp (!) 97.4 F (36.3 C)   Ht 5\' 10"  (1.778 m)   Wt 197 lb 9.6 oz (89.6 kg)   SpO2 99%   BMI 28.35 kg/m   Appears  well nourished, well groomed  and in no distress.  Eyes: PERRLA, EOMs, conjunctiva no swelling or erythema. Sinuses: No frontal/maxillary tenderness ENT/Mouth: EAC's clear, TM's nl w/o erythema, bulging. Nares clear w/o erythema, swelling, exudates. Oropharynx clear without erythema or exudates. Oral hygiene is good. Tongue normal, non obstructing. Hearing intact.  Neck: Supple. Thyroid not palpable. Car 2+/2+ without bruits, nodes or JVD. Chest: Respirations nl with BS clear & equal w/o rales, rhonchi, wheezing or stridor.  Cor: Heart sounds normal w/ regular rate and rhythm without sig. murmurs, gallops, clicks or rubs. Peripheral pulses normal and equal  without edema.  Abdomen: Soft & bowel sounds normal. Non-tender w/o guarding, rebound, hernias, masses or organomegaly.  Lymphatics: Unremarkable.  Musculoskeletal: Full ROM all peripheral extremities, joint stability, 5/5 strength and normal gait.  Skin: Warm, dry without exposed rashes, lesions or ecchymosis apparent.  Neuro: Cranial nerves intact, reflexes equal bilaterally. Sensory-motor testing grossly intact. Tendon reflexes grossly intact.  Pysch: Alert & oriented x 3.  Insight and judgement nl & appropriate. No ideations.   Assessment and Plan:   1.  Essential hypertension (Primary)  - Continue medication, monitor blood pressure at home.  - Continue DASH diet.  Reminder to go to the ER if any CP,  SOB, nausea, dizziness, severe HA, changes vision/speech.   - CBC with Differential/Platelet - COMPLETE METABOLIC PANEL WITH GFR - Magnesium - TSH   2. Hyperlipidemia, mixed  - Continue diet/meds, exercise,& lifestyle modifications.  - Continue monitor periodic cholesterol/liver & renal functions    -  Lipid panel - TSH   3. Abnormal glucose  - Continue diet, exercise  - Lifestyle modifications.  - Monitor appropriate labs   - Hemoglobin A1c - Insulin, random   4. Vitamin D deficiency  - Continue supplementation   - VITAMIN D 25 Hydroxy   5. CKD (chronic kidney disease) stage 2  - COMPLETE METABOLIC PANEL WITH GFR   6. Medication management  - CBC with Differential/Platelet - COMPLETE METABOLIC PANEL WITH GFR - Magnesium - Lipid panel - TSH - Hemoglobin A1c - Insulin, random - VITAMIN D 25 Hydroxy         Discussed  regular exercise, BP monitoring, weight control to achieve/maintain BMI less than 25 and discussed med and SE's. Recommended labs to assess /monitor clinical status .  I discussed the assessment and treatment plan with the patient. The patient was provided an opportunity to ask questions and all were answered. The patient agreed with the plan and demonstrated an understanding of the instructions.  I provided over 30 minutes of exam, counseling, chart review and  complex critical decision making.        The patient was advised to call back or seek an in-person evaluation if the symptoms worsen or if the condition fails to improve as anticipated.   Marinus Maw, MD

## 2023-10-31 ENCOUNTER — Other Ambulatory Visit: Payer: Self-pay | Admitting: Nurse Practitioner

## 2023-10-31 ENCOUNTER — Encounter: Payer: Self-pay | Admitting: Internal Medicine

## 2023-10-31 ENCOUNTER — Ambulatory Visit (INDEPENDENT_AMBULATORY_CARE_PROVIDER_SITE_OTHER): Payer: PPO | Admitting: Internal Medicine

## 2023-10-31 VITALS — BP 133/81 | HR 53 | Temp 97.4°F | Ht 70.0 in | Wt 197.6 lb

## 2023-10-31 DIAGNOSIS — I10 Essential (primary) hypertension: Secondary | ICD-10-CM

## 2023-10-31 DIAGNOSIS — N182 Chronic kidney disease, stage 2 (mild): Secondary | ICD-10-CM

## 2023-10-31 DIAGNOSIS — R7309 Other abnormal glucose: Secondary | ICD-10-CM | POA: Diagnosis not present

## 2023-10-31 DIAGNOSIS — E782 Mixed hyperlipidemia: Secondary | ICD-10-CM | POA: Diagnosis not present

## 2023-10-31 DIAGNOSIS — Z79899 Other long term (current) drug therapy: Secondary | ICD-10-CM | POA: Diagnosis not present

## 2023-10-31 DIAGNOSIS — E559 Vitamin D deficiency, unspecified: Secondary | ICD-10-CM | POA: Diagnosis not present

## 2023-10-31 DIAGNOSIS — N138 Other obstructive and reflux uropathy: Secondary | ICD-10-CM

## 2023-11-01 LAB — CBC WITH DIFFERENTIAL/PLATELET
Absolute Lymphocytes: 1949 {cells}/uL (ref 850–3900)
Absolute Monocytes: 577 {cells}/uL (ref 200–950)
Basophils Absolute: 73 {cells}/uL (ref 0–200)
Basophils Relative: 1 %
Eosinophils Absolute: 226 {cells}/uL (ref 15–500)
Eosinophils Relative: 3.1 %
HCT: 44.4 % (ref 38.5–50.0)
Hemoglobin: 14.5 g/dL (ref 13.2–17.1)
MCH: 30 pg (ref 27.0–33.0)
MCHC: 32.7 g/dL (ref 32.0–36.0)
MCV: 91.7 fL (ref 80.0–100.0)
MPV: 10.3 fL (ref 7.5–12.5)
Monocytes Relative: 7.9 %
Neutro Abs: 4475 {cells}/uL (ref 1500–7800)
Neutrophils Relative %: 61.3 %
Platelets: 222 10*3/uL (ref 140–400)
RBC: 4.84 10*6/uL (ref 4.20–5.80)
RDW: 12.7 % (ref 11.0–15.0)
Total Lymphocyte: 26.7 %
WBC: 7.3 10*3/uL (ref 3.8–10.8)

## 2023-11-01 LAB — COMPLETE METABOLIC PANEL WITH GFR
AG Ratio: 1.8 (calc) (ref 1.0–2.5)
ALT: 17 U/L (ref 9–46)
AST: 22 U/L (ref 10–35)
Albumin: 4.3 g/dL (ref 3.6–5.1)
Alkaline phosphatase (APISO): 89 U/L (ref 35–144)
BUN: 17 mg/dL (ref 7–25)
CO2: 31 mmol/L (ref 20–32)
Calcium: 9.6 mg/dL (ref 8.6–10.3)
Chloride: 104 mmol/L (ref 98–110)
Creat: 1.21 mg/dL (ref 0.70–1.28)
Globulin: 2.4 g/dL (ref 1.9–3.7)
Glucose, Bld: 84 mg/dL (ref 65–99)
Potassium: 4.3 mmol/L (ref 3.5–5.3)
Sodium: 141 mmol/L (ref 135–146)
Total Bilirubin: 0.8 mg/dL (ref 0.2–1.2)
Total Protein: 6.7 g/dL (ref 6.1–8.1)
eGFR: 61 mL/min/{1.73_m2} (ref >=60)

## 2023-11-01 LAB — LIPID PANEL
Cholesterol: 135 mg/dL (ref <200)
HDL: 57 mg/dL (ref >=40)
LDL Cholesterol (Calc): 61 mg/dL
Non-HDL Cholesterol (Calc): 78 mg/dL (ref <130)
Total CHOL/HDL Ratio: 2.4 (calc) (ref <5.0)
Triglycerides: 83 mg/dL (ref <150)

## 2023-11-01 LAB — VITAMIN D 25 HYDROXY (VIT D DEFICIENCY, FRACTURES): Vit D, 25-Hydroxy: 57 ng/mL (ref 30–100)

## 2023-11-01 LAB — HEMOGLOBIN A1C
Hgb A1c MFr Bld: 5.4 %{Hb} (ref <5.7)
Mean Plasma Glucose: 108 mg/dL
eAG (mmol/L): 6 mmol/L

## 2023-11-01 LAB — INSULIN, RANDOM: Insulin: 5.9 u[IU]/mL

## 2023-11-01 LAB — TSH: TSH: 3.14 m[IU]/L (ref 0.40–4.50)

## 2023-11-01 LAB — MAGNESIUM: Magnesium: 2.1 mg/dL (ref 1.5–2.5)

## 2023-11-02 NOTE — Progress Notes (Signed)
[][][][][][][][][][][][][][][][][][][][][][][][][][][][][][][][][][][][][][][][][]][][][][][][][][][][][][][][][][][][][][][][][[][][][][]   -  Test results slightly outside the reference range are not unusual. If there is anything important, I will review this with you,  otherwise it is considered normal test values.  If you have further questions,  please do not hesitate to contact me at the office or via My Chart.    [] [] [] [] [] [] [] [] [] [] [] [] [] [] [] [] [] [] [] [] [] [] [] [] [] [] [] [] [] [] [] [] [] [] [] [] [] [] [] [] [] ][] [] [] [] [] [] [] [] [] [] [] [] [] [] [] [] [] [] [] [] [] [] [[] [] [] [] []    -  Chol = 135  Excellent   - Very low risk for Heart Attack  / Stroke   [] [] [] [] [] [] [] [] [] [] [] [] [] [] [] [] [] [] [] [] [] [] [] [] [] [] [] [] [] [] [] [] [] [] [] [] [] [] [] [] [] ][] [] [] [] [] [] [] [] [] [] [] [] [] [] [] [] [] [] [] [] [] [] [[] [] [] [] []    -  A1c - Normal - No Diabetes  - Great !   [] [] [] [] [] [] [] [] [] [] [] [] [] [] [] [] [] [] [] [] [] [] [] [] [] [] [] [] [] [] [] [] [] [] [] [] [] [] [] [] [] ][] [] [] [] [] [] [] [] [] [] [] [] [] [] [] [] [] [] [] [] [] [] [[] [] [] [] []    -  Vitamin D = 57 - Great - Please keep dosage same    [] [] [] [] [] [] [] [] [] [] [] [] [] [] [] [] [] [] [] [] [] [] [] [] [] [] [] [] [] [] [] [] [] [] [] [] [] [] [] [] [] ][] [] [] [] [] [] [] [] [] [] [] [] [] [] [] [] [] [] [] [] [] [] [[] [] [] [] []    -  All Else - CBC - Kidneys - Electrolytes - Liver - Magnesium & Thyroid    - all  Normal / OK   [] [] [] [] [] [] [] [] [] [] [] [] [] [] [] [] [] [] [] [] [] [] [] [] [] [] [] [] [] [] [] [] [] [] [] [] [] [] [] [] [] ][] [] [] [] [] [] [] [] [] [] [] [] [] [] [] [] [] [] [] [] [] [] [[] [] [] [] []

## 2023-11-24 LAB — HM COLONOSCOPY

## 2023-12-03 ENCOUNTER — Encounter: Payer: Self-pay | Admitting: Nurse Practitioner

## 2023-12-03 DIAGNOSIS — N401 Enlarged prostate with lower urinary tract symptoms: Secondary | ICD-10-CM

## 2023-12-05 ENCOUNTER — Encounter: Payer: Self-pay | Admitting: Nurse Practitioner

## 2023-12-05 DIAGNOSIS — I10 Essential (primary) hypertension: Secondary | ICD-10-CM

## 2023-12-05 DIAGNOSIS — E782 Mixed hyperlipidemia: Secondary | ICD-10-CM

## 2023-12-05 MED ORDER — FINASTERIDE 5 MG PO TABS: ORAL_TABLET | ORAL | 3 refills | Status: DC

## 2023-12-05 MED ORDER — SIMVASTATIN 40 MG PO TABS: ORAL_TABLET | ORAL | 3 refills | Status: DC

## 2023-12-05 MED ORDER — ATENOLOL 25 MG PO TABS: ORAL_TABLET | ORAL | 2 refills | Status: DC

## 2023-12-21 ENCOUNTER — Telehealth: Payer: Self-pay

## 2023-12-21 NOTE — Telephone Encounter (Signed)
Copied from CRM [id number]. Topic: Appointments - Transfer of Care >> Dec 20, 2023  4:28 PM [name] wrote: Pt is requesting to transfer FROM: Lucky Cowboy Pt is requesting to transfer TO: Allwardt, Alyssa Reason for requested transfer: Provider passed away. It is the responsibility of the team the patient would like to transfer to (Dr. Doloris Hall) to reach out to the patient if for any reason this transfer is not acceptable.  Pt requesting TOC and scheduled 03/26/24, requesting annual CPE at this visit. Please advise

## 2023-12-21 NOTE — Telephone Encounter (Signed)
Copied from CRM [id number]. Topic: Appointments - Transfer of Care >> Dec 20, 2023  4:28 PM [name] wrote: Pt is requesting to transfer FROM: Brendan Monroe Pt is requesting to transfer TO: Brendan Monroe, Brendan Monroe Reason for requested transfer: Provider passed away. It is the responsibility of the team the patient would like to transfer to (Dr. Doloris Hall) to reach out to the patient if for any reason this transfer is not acceptable.  Please see CRM in regards to [facility]; pt scheduled for 03/26/2024 for est care. Pt also called back and requested CPE be completed during this visit. Please advise

## 2023-12-21 NOTE — Telephone Encounter (Signed)
Copied from CRM [id number]. Topic: Appointments - Scheduling Inquiry for Clinic >> Dec 20, 2023  4:48 PM Alcus Dad wrote: Reason for CRM: Patient wants to know if he can get physical done as well on appt. Day. May 19 @ 1pm  Duplicate CRM; advised annual CPE requested by patient at 03/26/24 visit. Nothing further needed at this time.

## 2023-12-22 NOTE — Telephone Encounter (Signed)
Pt scheduled; noted

## 2024-01-04 DIAGNOSIS — L821 Other seborrheic keratosis: Secondary | ICD-10-CM | POA: Diagnosis not present

## 2024-01-04 DIAGNOSIS — D225 Melanocytic nevi of trunk: Secondary | ICD-10-CM | POA: Diagnosis not present

## 2024-01-04 DIAGNOSIS — Z7189 Other specified counseling: Secondary | ICD-10-CM | POA: Diagnosis not present

## 2024-01-04 DIAGNOSIS — D485 Neoplasm of uncertain behavior of skin: Secondary | ICD-10-CM | POA: Diagnosis not present

## 2024-01-04 DIAGNOSIS — R208 Other disturbances of skin sensation: Secondary | ICD-10-CM | POA: Diagnosis not present

## 2024-01-04 DIAGNOSIS — D3701 Neoplasm of uncertain behavior of lip: Secondary | ICD-10-CM | POA: Diagnosis not present

## 2024-01-04 DIAGNOSIS — L814 Other melanin hyperpigmentation: Secondary | ICD-10-CM | POA: Diagnosis not present

## 2024-01-04 DIAGNOSIS — C4402 Squamous cell carcinoma of skin of lip: Secondary | ICD-10-CM | POA: Diagnosis not present

## 2024-01-24 DIAGNOSIS — C001 Malignant neoplasm of external lower lip: Secondary | ICD-10-CM | POA: Diagnosis not present

## 2024-01-31 ENCOUNTER — Ambulatory Visit: Payer: PPO | Admitting: Nurse Practitioner

## 2024-02-01 ENCOUNTER — Ambulatory Visit: Payer: PPO | Admitting: Nurse Practitioner

## 2024-02-21 ENCOUNTER — Encounter: Payer: Self-pay | Admitting: Physician Assistant

## 2024-02-21 ENCOUNTER — Other Ambulatory Visit: Payer: Self-pay | Admitting: Family

## 2024-02-21 DIAGNOSIS — I10 Essential (primary) hypertension: Secondary | ICD-10-CM

## 2024-02-21 MED ORDER — HYDROCHLOROTHIAZIDE 12.5 MG PO TABS
ORAL_TABLET | ORAL | 3 refills | Status: AC
Start: 2024-02-21 — End: ?

## 2024-02-21 NOTE — Telephone Encounter (Signed)
 Please see patient message and advise if ok to refill for patient. Pt scheduled to est care with PCP in May.

## 2024-02-28 ENCOUNTER — Telehealth: Payer: Self-pay

## 2024-02-28 NOTE — Telephone Encounter (Signed)
 Copied from CRM [id number]. Topic: General - Other >> Feb 24, 2024  8:47 AM [place] A wrote: Reason for CRM: Patient's wife called to follow-up on message she sent on mychart on 02/21/24-please respond  Returned pt wife Haskell Linker) call and advised this refill was sent to the pharmacy on 02/21/24. Pt looking forward to meeting PCP in May.

## 2024-03-19 ENCOUNTER — Encounter (HOSPITAL_COMMUNITY): Payer: Self-pay

## 2024-03-23 DIAGNOSIS — H02831 Dermatochalasis of right upper eyelid: Secondary | ICD-10-CM | POA: Diagnosis not present

## 2024-03-23 DIAGNOSIS — H40023 Open angle with borderline findings, high risk, bilateral: Secondary | ICD-10-CM | POA: Diagnosis not present

## 2024-03-23 DIAGNOSIS — H04123 Dry eye syndrome of bilateral lacrimal glands: Secondary | ICD-10-CM | POA: Diagnosis not present

## 2024-03-23 DIAGNOSIS — H25813 Combined forms of age-related cataract, bilateral: Secondary | ICD-10-CM | POA: Diagnosis not present

## 2024-03-23 DIAGNOSIS — H02834 Dermatochalasis of left upper eyelid: Secondary | ICD-10-CM | POA: Diagnosis not present

## 2024-03-26 ENCOUNTER — Ambulatory Visit (INDEPENDENT_AMBULATORY_CARE_PROVIDER_SITE_OTHER): Admitting: Physician Assistant

## 2024-03-26 ENCOUNTER — Encounter: Payer: Self-pay | Admitting: Physician Assistant

## 2024-03-26 ENCOUNTER — Ambulatory Visit: Payer: Medicare Other | Admitting: Physician Assistant

## 2024-03-26 VITALS — BP 120/78 | HR 55 | Temp 97.7°F | Ht 70.0 in | Wt 191.4 lb

## 2024-03-26 DIAGNOSIS — I619 Nontraumatic intracerebral hemorrhage, unspecified: Secondary | ICD-10-CM | POA: Diagnosis not present

## 2024-03-26 DIAGNOSIS — Z8679 Personal history of other diseases of the circulatory system: Secondary | ICD-10-CM

## 2024-03-26 DIAGNOSIS — I10 Essential (primary) hypertension: Secondary | ICD-10-CM | POA: Diagnosis not present

## 2024-03-26 DIAGNOSIS — Z710 Person encountering health services to consult on behalf of another person: Secondary | ICD-10-CM | POA: Insufficient documentation

## 2024-03-26 DIAGNOSIS — Z9889 Other specified postprocedural states: Secondary | ICD-10-CM

## 2024-03-26 DIAGNOSIS — N400 Enlarged prostate without lower urinary tract symptoms: Secondary | ICD-10-CM | POA: Insufficient documentation

## 2024-03-26 DIAGNOSIS — Z719 Counseling, unspecified: Secondary | ICD-10-CM | POA: Insufficient documentation

## 2024-03-26 DIAGNOSIS — Z48817 Encounter for surgical aftercare following surgery on the skin and subcutaneous tissue: Secondary | ICD-10-CM | POA: Diagnosis not present

## 2024-03-26 DIAGNOSIS — R413 Other amnesia: Secondary | ICD-10-CM | POA: Insufficient documentation

## 2024-03-26 DIAGNOSIS — E559 Vitamin D deficiency, unspecified: Secondary | ICD-10-CM

## 2024-03-26 DIAGNOSIS — F09 Unspecified mental disorder due to known physiological condition: Secondary | ICD-10-CM | POA: Insufficient documentation

## 2024-03-26 DIAGNOSIS — K219 Gastro-esophageal reflux disease without esophagitis: Secondary | ICD-10-CM | POA: Insufficient documentation

## 2024-03-26 HISTORY — DX: Person encountering health services to consult on behalf of another person: Z71.0

## 2024-03-26 NOTE — Patient Instructions (Signed)
 Welcome to [facility]! It was a pleasure meeting you today.   PLEASE NOTE:  If you had any LAB tests please let us know if you have not heard back within a few days. You may see your results on MyChart before we have a chance to review them but we will give you a call once they are reviewed by [place]. If we ordered any REFERRALS today, please let us know if you have not heard from their office within the next two weeks. Let us know through MyChart if you are needing REFILLS, or have your pharmacy send [place] the request. You can also use MyChart to communicate with me or any office staff.  Please try these tips to maintain a healthy lifestyle:  Eat most of your calories during the day when you are active. Eliminate processed foods including packaged sweets (pies, cakes, cookies), reduce intake of potatoes, white bread, white pasta, and white rice. Look for whole grain options, oat flour or almond flour.  Each meal should contain half fruits/vegetables, one quarter protein, and one quarter carbs (no bigger than a computer mouse).  Cut down on sweet beverages. This includes juice, soda, and sweet tea. Also watch fruit intake, though this is a healthier sweet option, it still contains natural sugar! Limit to 3 servings daily.  Drink at least 1 glass of water with each meal and aim for at least 8 glasses (64 ounces) per day.  Exercise at least 150 minutes every week to the best of your ability.    Take Care,  Audel Coakley, PA-C

## 2024-03-26 NOTE — Progress Notes (Signed)
 Patient ID: Brendan Castilleja., male    DOB: [birth year], 78 y.o.   MRN: [id number]   Assessment & Plan:  There are no diagnoses linked to this encounter.    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 No current signs of dementia. Managed with baby aspirin. - Continue baby aspirin, Zocor  20 mg daily - Follow-up in 6 months unless new symptoms develop.  Hypertension Hypertension is well-controlled with current medication regimen. No reported dizziness or other side effects. - Continue current antihypertensive medication. - Atenolol  25 mg take 0.5 tab po every day  - hydrochlorothiazide  12.5 mg every day   Benign prostatic hyperplasia Benign prostatic hyperplasia managed with medication prescribed by previous provider. Symptoms have improved with current treatment. - Continue current medication. - Proscar  5 mg; Flomax  0.4 mg   History of colon polyps Recent colonoscopy in December 2024 showed no polyps per patient.      No follow-ups on file.    Subjective:    Chief Complaint  Patient presents with   New Patient (Initial Visit)    Pt seen in office today to establish care; former [place] pt.     HPI Discussed the use of AI scribe software for clinical note transcription with the patient, who gave verbal consent to proceed.  History of Present Illness Brendan Njoku. is a 78 year old male who presents for a routine follow-up. Here with wife.  He has a history of a cerebral aneurysm at age 89, resulting in short-term memory issues. These memory issues have been evaluated and are not associated with dementia. He is not on blood thinners but takes baby aspirin.  His cholesterol levels are well-managed, and his last labs in December showed good results, including an A1c level that is not near prediabetes. He was previously on vitamin D  supplements, taking 2000 IU  every other day, but has reduced the intake based on advice from the [place].  He is on hydrochlorothiazide  for blood pressure management and reports no issues with dizziness. His blood pressure readings have been stable and within normal range.  He has a history of prostate issues for which he is on medication prescribed by his previous doctor, Dr. Kathye Parkin. The medication has been effective in managing his symptoms.  He underwent a colonoscopy in December, which showed no pre-cancerous polyps. He has a family history of colon cancer, with his father having had the disease. His mother passed from Alzheimer's, and he has two sisters who passed, one from cancer and one from dementia.  He does not smoke or drink and is physically active, engaging in yard work and spending time outdoors.     Past Medical History:  Diagnosis Date   BPH (benign prostatic hyperplasia)    Cancer [facility]) March 2025   Skin   GERD (gastroesophageal reflux disease)    Hyperlipidemia    Hypertension    Person encountering health services to consult on behalf of another person 03/26/2024   Prediabetes    Vitamin D  deficiency     Past Surgical History:  Procedure Laterality Date   CEREBRAL ANEURYSM REPAIR  1966    Family History  Problem Relation Age of Onset   Cancer Father        colon   Alzheimer's disease Mother    Diabetes Sister     Social History   Tobacco Use   Smoking status: Former  Current packs/day: 0.00    Types: Cigarettes    Quit date: 04/30/1974    Years since quitting: 49.9   Smokeless tobacco: Never  Vaping Use   Vaping status: Never Used  Substance Use Topics   Alcohol use: No   Drug use: Never     No Known Allergies  Review of Systems NEGATIVE UNLESS OTHERWISE INDICATED IN HPI      Objective:     BP 120/78 (BP Location: Right Arm, Patient Position: Sitting, Cuff Size: Normal)   Pulse (!) 55   Temp 97.7 F (36.5 C) (Temporal)   Ht 5\' 10"  (1.778 m)   Wt 191 lb 6.4 oz  (86.8 kg)   SpO2 100%   BMI 27.46 kg/m   Wt Readings from Last 3 Encounters:  03/26/24 191 lb 6.4 oz (86.8 kg)  10/31/23 197 lb 9.6 oz (89.6 kg)  07/27/23 192 lb 3.2 oz (87.2 kg)    BP Readings from Last 3 Encounters:  03/26/24 120/78  10/31/23 133/81  07/27/23 110/60     Physical Exam Vitals and nursing note reviewed.  Constitutional:      Appearance: Normal appearance.  HENT:     Head: Normocephalic.     Mouth/Throat:     Mouth: Mucous membranes are moist.  Eyes:     Extraocular Movements: Extraocular movements intact.     Conjunctiva/sclera: Conjunctivae normal.     Pupils: Pupils are equal, round, and reactive to light.  Cardiovascular:     Rate and Rhythm: Regular rhythm. Bradycardia present.  Pulmonary:     Effort: Pulmonary effort is normal.     Breath sounds: Normal breath sounds.  Neurological:     General: No focal deficit present.     Mental Status: He is alert and oriented to person, place, and time.  Psychiatric:        Mood and Affect: Mood normal.        Behavior: Behavior normal.             Cj Edgell M Benedetto Ryder, PA-C

## 2024-03-27 ENCOUNTER — Encounter: Payer: PPO | Admitting: Internal Medicine

## 2024-04-13 DIAGNOSIS — H40023 Open angle with borderline findings, high risk, bilateral: Secondary | ICD-10-CM | POA: Diagnosis not present

## 2024-04-13 DIAGNOSIS — H53461 Homonymous bilateral field defects, right side: Secondary | ICD-10-CM | POA: Diagnosis not present

## 2024-04-13 DIAGNOSIS — H02834 Dermatochalasis of left upper eyelid: Secondary | ICD-10-CM | POA: Diagnosis not present

## 2024-04-13 DIAGNOSIS — H02831 Dermatochalasis of right upper eyelid: Secondary | ICD-10-CM | POA: Diagnosis not present

## 2024-04-13 DIAGNOSIS — H04123 Dry eye syndrome of bilateral lacrimal glands: Secondary | ICD-10-CM | POA: Diagnosis not present

## 2024-04-13 DIAGNOSIS — H25813 Combined forms of age-related cataract, bilateral: Secondary | ICD-10-CM | POA: Diagnosis not present

## 2024-04-19 ENCOUNTER — Ambulatory Visit

## 2024-04-19 VITALS — Ht 72.0 in | Wt 191.0 lb

## 2024-04-19 DIAGNOSIS — Z0000 Encounter for general adult medical examination without abnormal findings: Secondary | ICD-10-CM | POA: Diagnosis not present

## 2024-04-19 NOTE — Progress Notes (Signed)
 Subjective:   Brendan Caggiano. is a 78 y.o. who presents for a Medicare Wellness preventive visit.  As a reminder, Annual Wellness Visits don't include a physical exam, and some assessments may be limited, especially if this visit is performed virtually. We may recommend an in-person follow-up visit with your provider if needed.  Visit Complete: Virtual I connected with  Brendan Monroe. on 04/19/24 by a audio enabled telemedicine application and verified that I am speaking with the correct person using two identifiers.  Patient Location: Home  Provider Location: Office/Clinic  I discussed the limitations of evaluation and management by telemedicine. The patient expressed understanding and agreed to proceed.  Vital Signs: Because this visit was a virtual/telehealth visit, some criteria may be missing or patient reported. Any vitals not documented were not able to be obtained and vitals that have been documented are patient reported.  VideoDeclined- This patient declined Librarian, academic. Therefore the visit was completed with audio only.  Persons Participating in Visit: Patient.  AWV Questionnaire: No: Patient Medicare AWV questionnaire was not completed prior to this visit.  Cardiac Risk Factors include: advanced age (>20men, >38 women);dyslipidemia;male gender;hypertension     Objective:    Today's Vitals   04/19/24 1123  Weight: 191 lb (86.6 kg)  Height: 6' (1.829 m)   Body mass index is 25.9 kg/m.     04/19/2024   11:25 AM 07/27/2023    4:13 PM 09/29/2022    4:19 PM 09/28/2021    9:49 AM 08/25/2020    8:44 AM 05/02/2019    9:21 AM 04/21/2018    8:49 AM  Advanced Directives  Does Patient Have a Medical Advance Directive? Yes Yes Yes Yes Yes Yes Yes   Type of Estate agent of [place];Living will Healthcare Power of [place];Living will Healthcare Power of [place];Living will Healthcare Power of [place];Living  will Healthcare Power of [place];Living will Living will;Healthcare Power of Attorney Living will  Does patient want to make changes to medical advance directive?  No - Patient declined No - Patient declined No - Patient declined No - Patient declined No - Patient declined  No - Patient declined   Copy of Healthcare Power of Attorney in Chart? No - copy requested No - copy requested No - copy requested No - copy requested No - copy requested No - copy requested       Data saved with a previous flowsheet row definition    Current Medications (verified) Outpatient Encounter Medications as of 04/19/2024  Medication Sig   Ascorbic Acid 500 MG CAPS Take 1 tablet by mouth daily.   aspirin 81 MG tablet Take 81 mg by mouth daily.   atenolol  (TENORMIN ) 25 MG tablet Take 0.5 tablets by mouth daily.   B Complex-Biotin-FA (B-COMPLEX PO) Take by mouth.   Cholecalciferol (VITAMIN D  PO) Take 2,000 Units by mouth once. Every other day   finasteride  (PROSCAR ) 5 MG tablet Take 5 mg by mouth daily.   hydrochlorothiazide  (HYDRODIURIL ) 12.5 MG tablet Take 1 tablet Daily for BP & Fluid Retention /Ankle Swelling   Omega-3 Fatty Acids (FISH OIL) 1000 MG CAPS Take 1 capsule by mouth daily.   simvastatin  (ZOCOR ) 20 MG tablet Take 1 tablet by mouth every other day.   tamsulosin  (FLOMAX ) 0.4 MG CAPS capsule Take 0.4 mg by mouth daily.   zinc gluconate 50 MG tablet Take 50 mg by mouth daily.   No facility-administered encounter medications on file as of  04/19/2024.    Allergies (verified) Patient has no known allergies.   History: Past Medical History:  Diagnosis Date   BPH (benign prostatic hyperplasia)    Cancer ([facility]) March 2025   Skin   GERD (gastroesophageal reflux disease)    Hyperlipidemia    Hypertension    Person encountering health services to consult on behalf of another person 03/26/2024   Prediabetes    Vitamin D  deficiency    Past Surgical History:  Procedure Laterality Date   CEREBRAL  ANEURYSM REPAIR  1966   Family History  Problem Relation Age of Onset   Alzheimer's disease Mother    Colon cancer Father    Diabetes Sister    Dementia Sister    Cancer Sister    Social History   Socioeconomic History   Marital status: Married    Spouse name: Not on file   Number of children: Not on file   Years of education: Not on file   Highest education level: GED or equivalent  Occupational History   Not on file  Tobacco Use   Smoking status: Former    Current packs/day: 0.00    Types: Cigarettes    Quit date: 04/30/1974    Years since quitting: 50.0   Smokeless tobacco: Never  Vaping Use   Vaping status: Never Used  Substance and Sexual Activity   Alcohol use: No   Drug use: Never   Sexual activity: Not Currently  Other Topics Concern   Not on file  Social History Narrative   Not on file   Social Drivers of Health   Financial Resource Strain: Low Risk  (04/19/2024)   Overall Financial Resource Strain (CARDIA)    Difficulty of Paying Living Expenses: Not hard at all  Food Insecurity: No Food Insecurity (04/19/2024)   Hunger Vital Sign    Worried About Running Out of Food in the Last Year: Never true    Ran Out of Food in the Last Year: Never true  Transportation Needs: No Transportation Needs (04/19/2024)   PRAPARE - Administrator, Civil Service (Medical): No    Lack of Transportation (Non-Medical): No  Physical Activity: Inactive (04/19/2024)   Exercise Vital Sign    Days of Exercise per Week: 0 days    Minutes of Exercise per Session: 0 min  Stress: No Stress Concern Present (04/19/2024)   [employer] of Occupational Health - Occupational Stress Questionnaire    Feeling of Stress: Not at all  Social Connections: Moderately Integrated (04/19/2024)   Social Connection and Isolation Panel    Frequency of Communication with Friends and Family: Once a week    Frequency of Social Gatherings with Friends and Family: More than three times a week     Attends Religious Services: More than 4 times per year    Active Member of [employer] or Organizations: No    Attends Engineer, structural: Never    Marital Status: Married    Tobacco Counseling Counseling given: Not Answered    Clinical Intake:  Pre-visit preparation completed: Yes  Pain : No/denies pain     BMI - recorded: 25.9 Nutritional Status: BMI 25 -29 Overweight Nutritional Risks: None Diabetes: No  Lab Results  Component Value Date   HGBA1C 5.4 10/31/2023   HGBA1C 5.4 03/21/2023   HGBA1C 5.3 09/29/2022     How often do you need to have someone help you when you read instructions, pamphlets, or other written materials from your doctor or pharmacy?:  1 - Never  Interpreter Needed?: No  Information entered by :: Lamont Pilsner, LPN   Activities of Daily Living      04/19/2024   11:24 AM 10/30/2023   11:41 PM  In your present state of health, do you have any difficulty performing the following activities:  Hearing? 0 0  Vision? 0 0  Difficulty concentrating or making decisions? 0 0  Walking or climbing stairs? 0 0  Dressing or bathing? 0 0  Doing errands, shopping? 0 0  Preparing Food and eating ? N   Using the Toilet? N   In the past six months, have you accidently leaked urine? N   Do you have problems with loss of bowel control? N   Managing your Medications? N   Managing your Finances? N   Housekeeping or managing your Housekeeping? N     Patient Care Team: Allwardt, Alyssa M, PA-C as PCP - General (Physician Assistant) Claudette Cue, MD (Inactive) as Consulting Physician (Gastroenterology)   I have updated your Care Teams any recent Medical Services you may have received from other providers in the past year.     Assessment:   This is a routine wellness examination for Isabel.  Hearing/Vision screen Hearing Screening - Comments:: Pt denies any hearing issues  Vision Screening - Comments:: Wears rx glasses - up to date with  routine eye exams with Dr Candi Chafe     Goals Addressed             This Visit's Progress    Patient Stated       Keep weight down        Depression Screen      04/19/2024   11:25 AM 03/26/2024   10:04 AM 10/30/2023   11:41 PM 07/27/2023    4:13 PM 09/29/2022    4:20 PM 09/28/2021    9:50 AM 08/25/2020    8:48 AM  PHQ 2/9 Scores  PHQ - 2 Score 0 0 0 0 0 0 0    Fall Risk      04/19/2024   11:27 AM 03/26/2024   10:04 AM 10/30/2023   11:41 PM 07/27/2023    4:13 PM 09/29/2022    4:20 PM  Fall Risk   Falls in the past year? 0 0 0 0 0  Number falls in past yr: 0 0  0 0  Injury with Fall? 0 0  0 0  Risk for fall due to : No Fall Risks No Fall Risks No Fall Risks Orthopedic patient No Fall Risks  Follow up Falls prevention discussed Falls evaluation completed Falls prevention discussed;Education provided;Falls evaluation completed Falls evaluation completed;Falls prevention discussed Falls evaluation completed;Falls prevention discussed      Data saved with a previous flowsheet row definition    MEDICARE RISK AT HOME:   Medicare Risk at Home Any stairs in or around the home?: Yes If so, are there any without handrails?: No Home free of loose throw rugs in walkways, pet beds, electrical cords, etc?: Yes Adequate lighting in your home to reduce risk of falls?: Yes Life alert?: No Use of a cane, walker or w/c?: No Grab bars in the bathroom?: Yes Shower chair or bench in shower?: Yes Elevated toilet seat or a handicapped toilet?: No  TIMED UP AND GO:  Was the test performed?  No  Cognitive Function: 6CIT completed    09/29/2022    4:17 PM  MMSE - Mini Mental State Exam  Orientation to time 2  Orientation to Place 5  Registration 3  Attention/ Calculation 3  Recall 1  Language- name 2 objects 2  Language- repeat 1  Language- follow 3 step command 3  Language- read & follow direction 1  Write a sentence 1  Copy design 1  Total score 23        04/19/2024    11:28 AM  6CIT Screen  What Year? 0 points  What month? 0 points  What time? 0 points  Count back from 20 0 points  Months in reverse 0 points  Repeat phrase 0 points  Total Score 0 points    Immunizations Immunization History  Administered Date(s) Administered   Influenza, High Dose Seasonal PF 08/21/2014, 09/09/2015, 07/06/2016, 09/06/2018, 08/25/2020, 07/27/2023   Influenza-Unspecified 08/08/2018, 08/16/2019, 08/29/2021, 09/08/2022   Pneumococcal Conjugate-13 01/30/2019   Pneumococcal Polysaccharide-23 06/05/2009   Td 01/26/2007, 04/21/2018    Screening Tests Health Maintenance  Topic Date Due   COVID-19 Vaccine (1) Never done   Zoster Vaccines- Shingrix (1 of 2) Never done   INFLUENZA VACCINE  06/08/2024   Medicare Annual Wellness (AWV)  04/19/2025   DTaP/Tdap/Td (3 - Tdap) 04/21/2028   Colonoscopy  11/23/2028   Pneumococcal Vaccine: 50+ Years  Completed   HPV VACCINES  Aged Out   Meningococcal B Vaccine  Aged Out   Hepatitis C Screening  Discontinued    Health Maintenance  Health Maintenance Due  Topic Date Due   COVID-19 Vaccine (1) Never done   Zoster Vaccines- Shingrix (1 of 2) Never done   Health Maintenance Items Addressed: See Nurse Notes at the end of this note  Additional Screening:  Vision Screening: Recommended annual ophthalmology exams for early detection of glaucoma and other disorders of the eye. Would you like a referral to an eye doctor? No    Dental Screening: Recommended annual dental exams for proper oral hygiene  Community Resource Referral / Chronic Care Management: CRR required this visit?  No   CCM required this visit?  No   Plan:    I have personally reviewed and noted the following in the patient's chart:   Medical and social history Use of alcohol, tobacco or illicit drugs  Current medications and supplements including opioid prescriptions. Patient is not currently taking opioid prescriptions. Functional ability and  status Nutritional status Physical activity Advanced directives List of other physicians Hospitalizations, surgeries, and ER visits in previous 12 months Vitals Screenings to include cognitive, depression, and falls Referrals and appointments  In addition, I have reviewed and discussed with patient certain preventive protocols, quality metrics, and best practice recommendations. A written personalized care plan for preventive services as well as general preventive health recommendations were provided to patient.   Bruno Capri, LPN   [id number]/[id number]   After Visit Summary: (MyChart) Due to this being a telephonic visit, the after visit summary with patients personalized plan was offered to patient via MyChart   Notes: Nothing significant to report at this time.

## 2024-04-19 NOTE — Patient Instructions (Signed)
 Mr. Nance , Thank you for taking time out of your busy schedule to complete your Annual Wellness Visit with me. I enjoyed our conversation and look forward to speaking with you again next year. I, as well as your care team,  appreciate your ongoing commitment to your health goals. Please review the following plan we discussed and let me know if I can assist you in the future. Your Game plan/ To Do List    Referrals: If you haven't heard from the office you've been referred to, please reach out to them at the phone provided.   Follow up Visits: Next Medicare AWV with our clinical staff: 04/25/25   Have you seen your provider in the last 6 months (3 months if uncontrolled diabetes)? Yes Next Office Visit with your provider: 10/02/24  Clinician Recommendations:  Each day, aim for 6 glasses of water, plenty of protein in your diet and try to get up and walk/ stretch every hour for 5-10 minutes at a time.        This is a list of the screening recommended for you and due dates:  Health Maintenance  Topic Date Due   COVID-19 Vaccine (1) Never done   Zoster (Shingles) Vaccine (1 of 2) Never done   Flu Shot  06/08/2024   Medicare Annual Wellness Visit  04/19/2025   DTaP/Tdap/Td vaccine (3 - Tdap) 04/21/2028   Colon Cancer Screening  11/23/2028   Pneumococcal Vaccine for age over 34  Completed   HPV Vaccine  Aged Out   Meningitis B Vaccine  Aged Out   Hepatitis C Screening  Discontinued    Advanced directives: (Copy Requested) Please bring a copy of your health care power of attorney and living will to the office to be added to your chart at your convenience. You can mail to [facility] [address] [place], [place] [address] or email to [email or web address] Advance Care Planning is important because it:  [x]  Makes sure you receive the medical care that is consistent with your values, goals, and preferences  [x]  It provides guidance to your family and loved ones  and reduces their decisional burden about whether or not they are making the right decisions based on your wishes.  Follow the link provided in your after visit summary or read over the paperwork we have mailed to you to help you started getting your Advance Directives in place. If you need assistance in completing these, please reach out to us  so that we can help you!  See attachments for Preventive Care and Fall Prevention Tips.

## 2024-04-25 DIAGNOSIS — H53461 Homonymous bilateral field defects, right side: Secondary | ICD-10-CM | POA: Diagnosis not present

## 2024-05-03 ENCOUNTER — Encounter: Payer: PPO | Admitting: Internal Medicine

## 2024-07-19 DIAGNOSIS — L821 Other seborrheic keratosis: Secondary | ICD-10-CM | POA: Diagnosis not present

## 2024-07-19 DIAGNOSIS — Z08 Encounter for follow-up examination after completed treatment for malignant neoplasm: Secondary | ICD-10-CM | POA: Diagnosis not present

## 2024-07-19 DIAGNOSIS — L814 Other melanin hyperpigmentation: Secondary | ICD-10-CM | POA: Diagnosis not present

## 2024-07-19 DIAGNOSIS — Z85828 Personal history of other malignant neoplasm of skin: Secondary | ICD-10-CM | POA: Diagnosis not present

## 2024-07-30 ENCOUNTER — Ambulatory Visit: Payer: PPO | Admitting: Nurse Practitioner

## 2024-08-06 ENCOUNTER — Encounter: Payer: Self-pay | Admitting: Pharmacist

## 2024-08-06 NOTE — Progress Notes (Signed)
 Pharmacy Quality Measure Review  This patient is appearing on a report for being at risk of failing the adherence measure for cholesterol (statin) medications this calendar year.   Medication: simvastatin  40mg   Last fill date: 12/06/2023 for 90 day supply  Lab Results  Component Value Date   CHOL 135 10/31/2023   HDL 57 10/31/2023   LDLCALC 61 10/31/2023   TRIG 83 10/31/2023   CHOLHDL 2.4 10/31/2023     Reviewed patient's chart and current medications list had that he is taking simvastatin  20mg  every OTHER day. He did fill simvastatin  40mg  #90 for 90 DS 06/17/2024 so he should have enough to last until appointment with PCP in November 2025. Patient is relatively new patient for Alyssa Allwardy, PAC. Previously was seen by Dr Elsie Richards Note added to PCP visit to consider sending Rx for simvastatin  with correct directions / SIG.   Insurance report was not up to date. No action needed at this time.  and Will collaborate with provider to update Rx for future refills.   Madelin Ray, PharmD Clinical Pharmacist [facility] Primary Care  Population Health [phone]

## 2024-08-13 DIAGNOSIS — H25813 Combined forms of age-related cataract, bilateral: Secondary | ICD-10-CM | POA: Diagnosis not present

## 2024-08-13 DIAGNOSIS — H53461 Homonymous bilateral field defects, right side: Secondary | ICD-10-CM | POA: Diagnosis not present

## 2024-08-13 DIAGNOSIS — H40023 Open angle with borderline findings, high risk, bilateral: Secondary | ICD-10-CM | POA: Diagnosis not present

## 2024-08-13 DIAGNOSIS — H02834 Dermatochalasis of left upper eyelid: Secondary | ICD-10-CM | POA: Diagnosis not present

## 2024-08-13 DIAGNOSIS — H02831 Dermatochalasis of right upper eyelid: Secondary | ICD-10-CM | POA: Diagnosis not present

## 2024-08-13 DIAGNOSIS — H04123 Dry eye syndrome of bilateral lacrimal glands: Secondary | ICD-10-CM | POA: Diagnosis not present

## 2024-10-02 ENCOUNTER — Encounter: Payer: Self-pay | Admitting: Physician Assistant

## 2024-10-02 ENCOUNTER — Ambulatory Visit: Admitting: Physician Assistant

## 2024-10-02 VITALS — BP 120/60 | HR 45 | Temp 97.0°F | Ht 72.0 in | Wt 175.0 lb

## 2024-10-02 DIAGNOSIS — R944 Abnormal results of kidney function studies: Secondary | ICD-10-CM

## 2024-10-02 DIAGNOSIS — Z23 Encounter for immunization: Secondary | ICD-10-CM | POA: Diagnosis not present

## 2024-10-02 DIAGNOSIS — I619 Nontraumatic intracerebral hemorrhage, unspecified: Secondary | ICD-10-CM | POA: Diagnosis not present

## 2024-10-02 DIAGNOSIS — N400 Enlarged prostate without lower urinary tract symptoms: Secondary | ICD-10-CM

## 2024-10-02 DIAGNOSIS — I10 Essential (primary) hypertension: Secondary | ICD-10-CM | POA: Diagnosis not present

## 2024-10-02 DIAGNOSIS — Z9889 Other specified postprocedural states: Secondary | ICD-10-CM

## 2024-10-02 DIAGNOSIS — Z8679 Personal history of other diseases of the circulatory system: Secondary | ICD-10-CM

## 2024-10-02 DIAGNOSIS — R634 Abnormal weight loss: Secondary | ICD-10-CM

## 2024-10-02 LAB — COMPREHENSIVE METABOLIC PANEL WITH GFR
ALT: 16 U/L (ref 0–53)
AST: 23 U/L (ref 0–37)
Albumin: 4.4 g/dL (ref 3.5–5.2)
Alkaline Phosphatase: 75 U/L (ref 39–117)
BUN: 23 mg/dL (ref 6–23)
CO2: 34 meq/L — ABNORMAL HIGH (ref 19–32)
Calcium: 9.7 mg/dL (ref 8.4–10.5)
Chloride: 103 meq/L (ref 96–112)
Creatinine, Ser: 1.15 mg/dL (ref 0.40–1.50)
GFR: 60.12 mL/min (ref >60.00)
Glucose, Bld: 91 mg/dL (ref 70–99)
Potassium: 4.5 meq/L (ref 3.5–5.1)
Sodium: 141 meq/L (ref 135–145)
Total Bilirubin: 0.9 mg/dL (ref 0.2–1.2)
Total Protein: 6.4 g/dL (ref 6.0–8.3)

## 2024-10-02 LAB — CBC WITH DIFFERENTIAL/PLATELET
Basophils Absolute: 0 K/uL (ref 0.0–0.1)
Basophils Relative: 0.9 % (ref 0.0–3.0)
Eosinophils Absolute: 0.2 K/uL (ref 0.0–0.7)
Eosinophils Relative: 3.3 % (ref 0.0–5.0)
HCT: 41.1 % (ref 39.0–52.0)
Hemoglobin: 13.7 g/dL (ref 13.0–17.0)
Lymphocytes Relative: 24.7 % (ref 12.0–46.0)
Lymphs Abs: 1.3 K/uL (ref 0.7–4.0)
MCHC: 33.3 g/dL (ref 30.0–36.0)
MCV: 91.7 fl (ref 78.0–100.0)
Monocytes Absolute: 0.5 K/uL (ref 0.1–1.0)
Monocytes Relative: 10.5 % (ref 3.0–12.0)
Neutro Abs: 3.2 K/uL (ref 1.4–7.7)
Neutrophils Relative %: 60.6 % (ref 43.0–77.0)
Platelets: 200 K/uL (ref 150.0–400.0)
RBC: 4.48 Mil/uL (ref 4.22–5.81)
RDW: 14.6 % (ref 11.5–15.5)
WBC: 5.2 K/uL (ref 4.0–10.5)

## 2024-10-02 LAB — LIPID PANEL
Cholesterol: 127 mg/dL (ref 0–200)
HDL: 59.8 mg/dL (ref >39.00)
LDL Cholesterol: 53 mg/dL (ref 0–99)
NonHDL: 66.79
Total CHOL/HDL Ratio: 2
Triglycerides: 67 mg/dL (ref 0.0–149.0)
VLDL: 13.4 mg/dL (ref 0.0–40.0)

## 2024-10-02 LAB — PSA: PSA: 0.28 ng/mL (ref 0.10–4.00)

## 2024-10-02 LAB — TSH: TSH: 3.5 u[IU]/mL (ref 0.35–5.50)

## 2024-10-02 LAB — HEMOGLOBIN A1C: Hgb A1c MFr Bld: 5.5 % (ref 4.6–6.5)

## 2024-10-02 NOTE — Patient Instructions (Signed)
  VISIT SUMMARY: You came in for a routine follow-up and lab work. Your blood pressure and cholesterol are well-controlled with your current medications. You have experienced significant weight loss due to increased physical activity and dietary changes. A flu shot was administered during this visit.  YOUR PLAN: ESSENTIAL HYPERTENSION: Your blood pressure is well-controlled with your current medication regimen. -Continue taking atenolol  and hydrochlorothiazide  as prescribed.  BENIGN PROSTATIC HYPERPLASIA: You have no changes in urinary symptoms. -Continue taking Proscar  and tamsulosin  as prescribed. -A PSA test has been ordered.  HYPERLIPIDEMIA: Your cholesterol levels are well-managed with Zocor . -Continue taking Zocor  20 mg daily. -A lipid panel has been ordered.  HISTORY OF CEREBRAL ANEURYSM WITH RESIDUAL SHORT-TERM MEMORY ISSUES: You have no new neurological symptoms. -No changes in your current management.  CHRONIC KIDNEY DISEASE, UNSPECIFIED STAGE: Previous labs showed decreased kidney function. -Monitoring required to assess kidney function.  INTENTIONAL WEIGHT LOSS: Your significant weight loss is due to increased physical activity and dietary changes. -No associated symptoms; continue with your current lifestyle changes.  GENERAL HEALTH MAINTENANCE: A flu shot was administered during this visit. -You will need your next flu shot at the end of October next year.                      Contains text generated by [employer]bridge.                                 Contains text generated by Abridge.

## 2024-10-02 NOTE — Progress Notes (Signed)
 Patient ID: Brendan Monroe., male    DOB: 22-Mar-1946, 78 y.o.   MRN: [id number]   Assessment & Plan:  Essential (primary) hypertension -     Comprehensive metabolic panel with GFR -     Lipid panel  Benign prostatic hyperplasia without lower urinary tract symptoms -     PSA  Cerebral hemorrhage (HCC) -     Lipid panel  Abnormal weight loss -     CBC with Differential/Platelet -     Comprehensive metabolic panel with GFR -     Hemoglobin A1c -     TSH -     PSA  History of cerebral aneurysm repair -     Lipid panel  Decreased GFR -     Comprehensive metabolic panel with GFR  Immunization due -     Flu vaccine HIGH DOSE PF(Fluzone Trivalent)    Assessment & Plan Essential hypertension Blood pressure is well-controlled with current medication regimen. - Continue atenolol  and hydrochlorothiazide .  Benign prostatic hyperplasia No changes in urinary symptoms. Continues on Proscar  and tamsulosin . - Continue Proscar  and tamsulosin . - Ordered PSA test.  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 Continue baby ASA and Zocor  daily.  Chronic kidney disease, unspecified stage Previous labs showed decreased GFR. Monitoring required to assess kidney function.  Intentional weight loss Significant weight loss attributed to increased physical activity and dietary changes. No associated symptoms. Update labs today   General Health Maintenance Flu shot administered. Discussed timing for next flu shot. - Will administer flu shot at the end of October next year.      Return in about 6 months (around 04/01/2025) for recheck/follow-up.    Subjective:    Chief Complaint  Patient presents with   Medical Management of Chronic Issues    Here for a 6 month follow up for hypertension with PCP.    Follow-up   Hypertension    Hypertension   Discussed the use of AI scribe software for clinical note transcription  with the patient, who gave verbal consent to proceed.  History of Present Illness Brendan Monroe. is an 78 year old male who presents for routine follow-up and lab work. He is accompanied by his wife.  He has not experienced any new health changes since his last visit. [facility] and prefers to have his blood work done at this facility. Although he had blood work done in June, he did not bring the results to this visit.  He has a history of a cerebral aneurysm at age 38, which resulted in short-term memory issues. He takes a baby aspirin daily. 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 No current headaches, vision changes, or dizziness.  He reports significant weight loss, which he attributes to increased physical activity and dietary changes, specifically reducing junk food intake. The weight loss began after moving into a new house and engaging in more outdoor work. He weighs himself daily and notes fluctuations, with a recent low of 169 pounds.  He takes atenolol  and hydrochlorothiazide  for blood pressure. He also takes Zocor  20 mg daily. His last A1c was 5.4 in December.  He takes Proscar  and tamsulosin  for prostate health and reports no changes in urination, bowel habits, or any discomfort. No night sweats, headaches, vision changes, or dizziness. He mentions having had headaches only after his brain aneurysm but none  recently.  He mentions that he cannot have an MRI due to a metal implant.     Past Medical History:  Diagnosis Date   BPH (benign prostatic hyperplasia)    Cancer (HCC) March 2025   Skin   GERD (gastroesophageal reflux disease)    Hyperlipidemia    Hypertension    Person encountering health services to consult on behalf of another person 03/26/2024   Prediabetes    Vitamin D  deficiency     Past Surgical History:  Procedure Laterality Date   CEREBRAL ANEURYSM REPAIR  1966    Family History   Problem Relation Age of Onset   Alzheimer's disease Mother    Colon cancer Father    Diabetes Sister    Dementia Sister    Cancer Sister     Social History   Tobacco Use   Smoking status: Former    Current packs/day: 0.00    Types: Cigarettes    Quit date: 04/30/1974    Years since quitting: 50.4   Smokeless tobacco: Never  Vaping Use   Vaping status: Never Used  Substance Use Topics   Alcohol use: No   Drug use: Never     No Known Allergies  Review of Systems NEGATIVE UNLESS OTHERWISE INDICATED IN HPI      Objective:     BP 120/60   Pulse (!) 45   Temp (!) 97 F (36.1 C) (Temporal)   Ht 6' (1.829 m)   Wt 175 lb (79.4 kg)   SpO2 98%   BMI 23.73 kg/m   Wt Readings from Last 3 Encounters:  10/02/24 175 lb (79.4 kg)  04/19/24 191 lb (86.6 kg)  03/26/24 191 lb 6.4 oz (86.8 kg)    BP Readings from Last 3 Encounters:  10/02/24 120/60  03/26/24 120/78  10/31/23 133/81     Physical Exam Vitals and nursing note reviewed.  Constitutional:      Appearance: Normal appearance.  HENT:     Head: Normocephalic.     Mouth/Throat:     Mouth: Mucous membranes are moist.  Eyes:     Extraocular Movements: Extraocular movements intact.     Conjunctiva/sclera: Conjunctivae normal.     Pupils: Pupils are equal, round, and reactive to light.  Cardiovascular:     Rate and Rhythm: Regular rhythm. Bradycardia present.  Pulmonary:     Effort: Pulmonary effort is normal.     Breath sounds: Normal breath sounds.  Neurological:     General: No focal deficit present.     Mental Status: He is alert and oriented to person, place, and time.  Psychiatric:        Mood and Affect: Mood normal.        Behavior: Behavior normal.             Monaye Blackie M Dareion Kneece, PA-C

## 2024-10-08 ENCOUNTER — Ambulatory Visit: Payer: Self-pay | Admitting: Physician Assistant

## 2024-10-30 ENCOUNTER — Other Ambulatory Visit: Payer: Self-pay

## 2024-10-30 ENCOUNTER — Encounter: Payer: Self-pay | Admitting: Physician Assistant

## 2024-10-30 MED ORDER — TAMSULOSIN HCL 0.4 MG PO CAPS: 0.4000 mg | ORAL_CAPSULE | Freq: Every day | ORAL | 1 refills | Status: AC

## 2024-10-30 NOTE — Telephone Encounter (Signed)
 Pt requesting refill of Flomax  previously filled by historical provider, ok to refill?

## 2024-12-03 ENCOUNTER — Other Ambulatory Visit: Payer: Self-pay

## 2024-12-03 ENCOUNTER — Encounter: Payer: Self-pay | Admitting: Physician Assistant

## 2024-12-03 DIAGNOSIS — N400 Enlarged prostate without lower urinary tract symptoms: Secondary | ICD-10-CM

## 2024-12-03 MED ORDER — FINASTERIDE 5 MG PO TABS: 5.0000 mg | ORAL_TABLET | Freq: Every day | ORAL | 0 refills | Status: AC

## 2025-04-09 ENCOUNTER — Ambulatory Visit: Admitting: Physician Assistant

## 2025-04-25 ENCOUNTER — Ambulatory Visit
